# Patient Record
Sex: Female | Born: 1950
Health system: Southern US, Community
[De-identification: ages and names within clinical notes are randomized; demographics above are authoritative.]

## PROBLEM LIST (undated history)

## (undated) DIAGNOSIS — I10 Essential (primary) hypertension: Secondary | ICD-10-CM

## (undated) DIAGNOSIS — M1990 Unspecified osteoarthritis, unspecified site: Secondary | ICD-10-CM

## (undated) DIAGNOSIS — E785 Hyperlipidemia, unspecified: Secondary | ICD-10-CM

## (undated) DIAGNOSIS — R112 Nausea with vomiting, unspecified: Secondary | ICD-10-CM

## (undated) DIAGNOSIS — Z9889 Other specified postprocedural states: Secondary | ICD-10-CM

## (undated) HISTORY — PX: TUBAL LIGATION: SHX77

## (undated) HISTORY — DX: Essential (primary) hypertension: I10

## (undated) HISTORY — DX: Hyperlipidemia, unspecified: E78.5

## (undated) HISTORY — DX: Unspecified osteoarthritis, unspecified site: M19.90

---

## 1988-03-09 HISTORY — PX: WRIST SURGERY: SHX841

## 2006-10-08 ENCOUNTER — Ambulatory Visit: Payer: Self-pay | Admitting: Internal Medicine

## 2007-09-22 ENCOUNTER — Ambulatory Visit: Payer: Self-pay | Admitting: Internal Medicine

## 2008-09-24 ENCOUNTER — Ambulatory Visit: Payer: Self-pay | Admitting: Internal Medicine

## 2008-12-05 ENCOUNTER — Other Ambulatory Visit: Payer: Self-pay | Admitting: Internal Medicine

## 2008-12-13 ENCOUNTER — Ambulatory Visit: Payer: Self-pay | Admitting: Internal Medicine

## 2009-09-25 ENCOUNTER — Ambulatory Visit: Payer: Self-pay | Admitting: Internal Medicine

## 2010-12-01 ENCOUNTER — Ambulatory Visit: Payer: Self-pay | Admitting: Internal Medicine

## 2011-07-20 IMAGING — CR PELVIS - 1-2 VIEW
1 series · 1 of 1 positions shown · non-contrast
Comparison: none

REASON FOR EXAM: 2wks pain server lt ill[facility]  [facility]
COMMENTS:

PROCEDURE:     DXR - DXR PELVIS AP ONLY  - December 05, 2008 [DATE]
RESULT:     There is no evidence of fracture, dislocation or malalignment.
Subchondral sclerosis is appreciated involving the pubic symphysis.

[view not recorded]
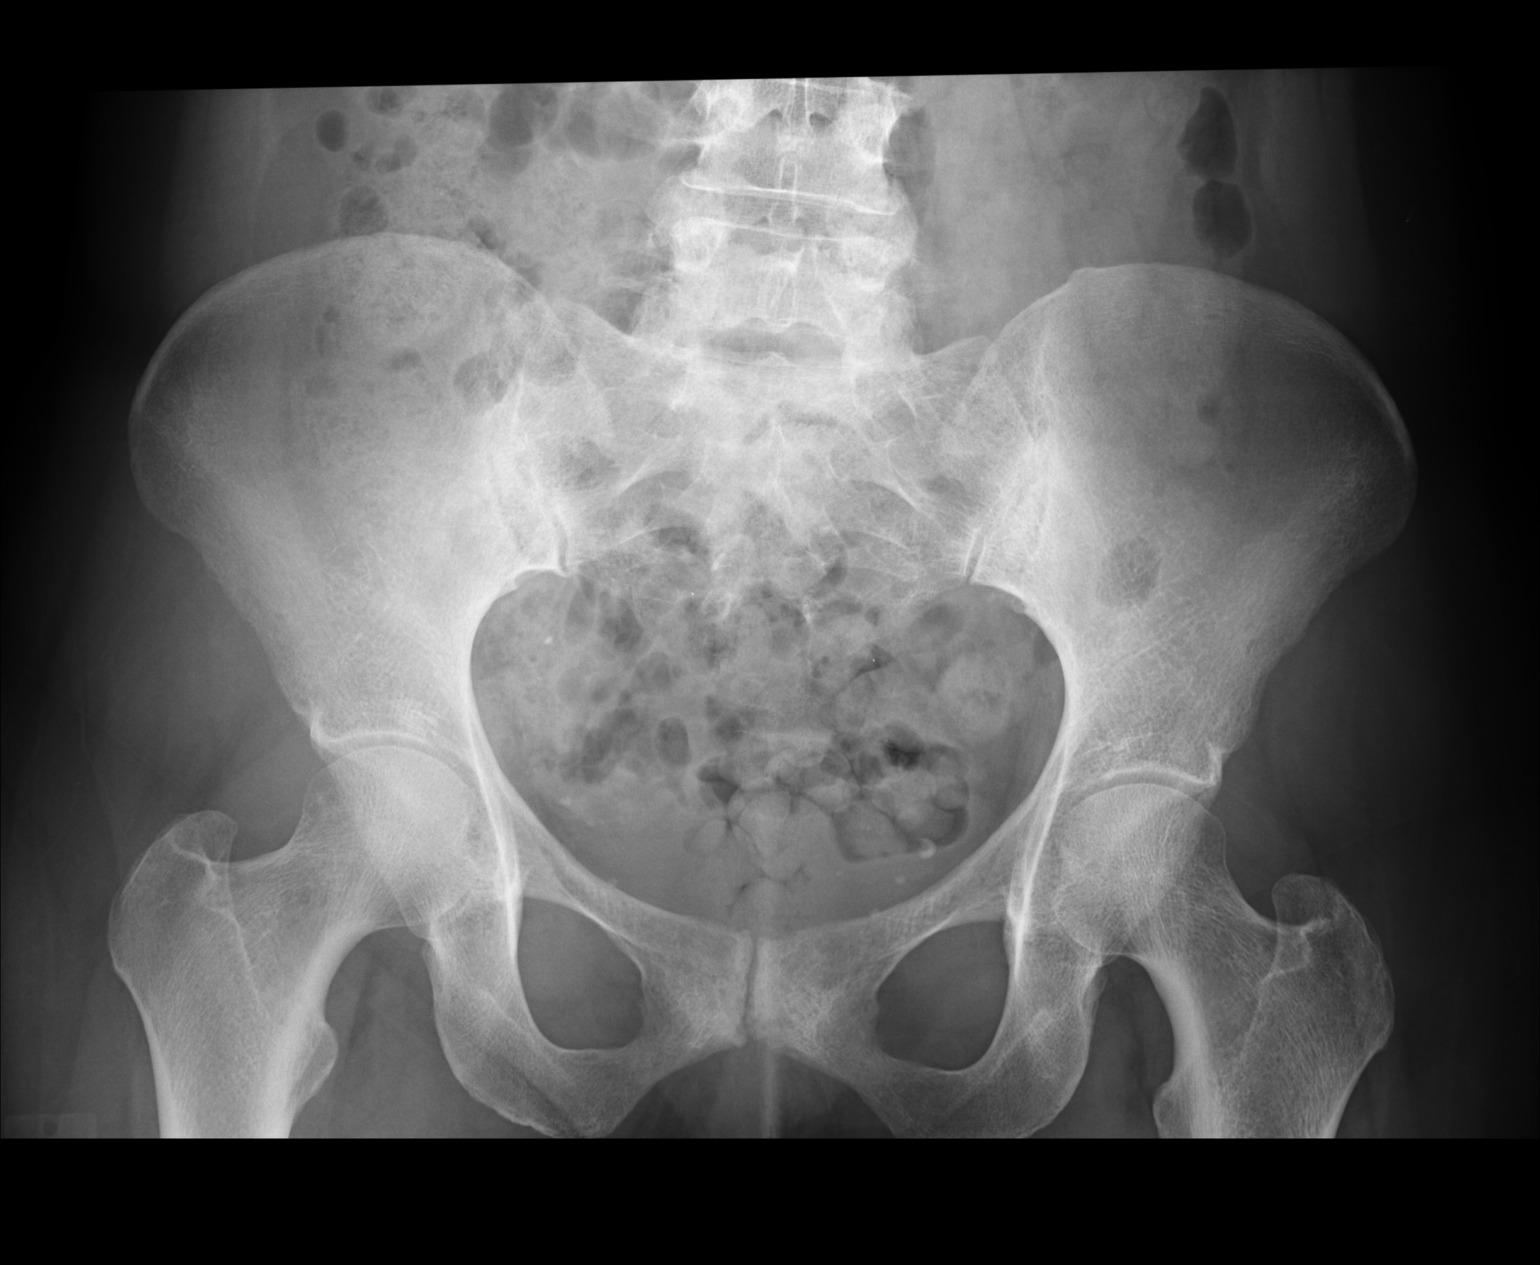

[1 of 1 positions shown; findings below may reference images not displayed]

IMPRESSION: 1. No evidence of acute osseous abnormalities.
2. Degenerative changes involve the pubic symphysis.
3. If there is persistent clinical concern, further evaluation with pelvic
fracture protocol MRI is recommended.

## 2011-12-03 ENCOUNTER — Ambulatory Visit: Payer: Self-pay | Admitting: Internal Medicine

## 2011-12-31 ENCOUNTER — Ambulatory Visit: Payer: Self-pay | Admitting: Internal Medicine

## 2018-10-07 ENCOUNTER — Other Ambulatory Visit: Payer: Self-pay | Admitting: Internal Medicine

## 2018-10-07 DIAGNOSIS — Z20822 Contact with and (suspected) exposure to covid-19: Secondary | ICD-10-CM

## 2018-10-09 LAB — NOVEL CORONAVIRUS, NAA: SARS-CoV-2, NAA: NOT DETECTED

## 2018-11-08 ENCOUNTER — Other Ambulatory Visit: Payer: Self-pay

## 2018-11-08 ENCOUNTER — Encounter: Payer: Self-pay | Admitting: Nurse Practitioner

## 2018-11-08 ENCOUNTER — Ambulatory Visit (INDEPENDENT_AMBULATORY_CARE_PROVIDER_SITE_OTHER): Payer: Medicare Other | Admitting: Nurse Practitioner

## 2018-11-08 VITALS — BP 114/57 | HR 73 | Ht 65.5 in | Wt 138.6 lb

## 2018-11-08 DIAGNOSIS — I10 Essential (primary) hypertension: Secondary | ICD-10-CM

## 2018-11-08 DIAGNOSIS — Z7689 Persons encountering health services in other specified circumstances: Secondary | ICD-10-CM

## 2018-11-08 DIAGNOSIS — E785 Hyperlipidemia, unspecified: Secondary | ICD-10-CM

## 2018-11-08 DIAGNOSIS — M545 Low back pain, unspecified: Secondary | ICD-10-CM

## 2018-11-08 DIAGNOSIS — G8929 Other chronic pain: Secondary | ICD-10-CM

## 2018-11-08 MED ORDER — ROSUVASTATIN CALCIUM 5 MG PO TABS: ORAL_TABLET | ORAL | 1 refills | Status: DC

## 2018-11-08 MED ORDER — LISINOPRIL 2.5 MG PO TABS: 5.0000 mg | ORAL_TABLET | Freq: Every day | ORAL | 1 refills | Status: DC

## 2018-11-08 MED ORDER — LIDOCAINE 4 % EX PTCH: 1.0000 | MEDICATED_PATCH | Freq: Every day | CUTANEOUS | 5 refills | Status: DC | PRN

## 2018-11-08 NOTE — Progress Notes (Signed)
Subjective:    Patient ID: Cassandra Cooley, female    DOB: 1950-12-25, 68 y.o.   MRN: [id number]  Cassandra Cooley is a 68 y.o. female presenting on 11/08/2018 for Establish Care (pt recently relocated to the area. )  HPI [place] Provider Pt last seen by PCP about 6 months ago from [place], [place] area.  Obtain records - [employer].    Hypertension - She is not checking BP at home or outside of clinic.    - Current medications: lisinopril 2.5 mg once daily, tolerating well without side effects - She is not currently symptomatic. - Pt denies headache, lightheadedness, dizziness, changes in vision, chest tightness/pressure, palpitations, leg swelling, sudden loss of speech or loss of consciousness. - Her diet is moderate in salt, moderate in fat, and moderate in carbohydrates.   Hyperlipidemia Lost weight, improved diet.  Needs recheck. - Patient denies muscle cramps.  Occasionally cannot sleep in shoulders/  Taking daily.  Decreased from 10 mg.   Lidocaine patch for back.   Widow x 99 years - husband died cancer  Past Medical History:  Diagnosis Date  . Arthritis    hands  . Hyperlipidemia   . Hypertension    Past Surgical History:  Procedure Laterality Date  . WRIST SURGERY Right 1990   Social History   Socioeconomic History  . Marital status: Widowed    Spouse name: Not on file  . Number of children: Not on file  . Years of education: Not on file  . Highest education level: Not on file  Occupational History  . Not on file  Social Needs  . Financial resource strain: Not on file  . Food insecurity    Worry: Not on file    Inability: Not on file  . Transportation needs    Medical: Not on file    Non-medical: Not on file  Tobacco Use  . Smoking status: Never Smoker  . Smokeless tobacco: Never Used  Substance and Sexual Activity  . Alcohol use: Yes    Alcohol/week: 7.0 standard drinks    Types: 7 Glasses of wine per week    Comment: glass red  wine  every evening   . Drug use: Never  . Sexual activity: Not on file  Lifestyle  . Physical activity    Days per week: Not on file    Minutes per session: Not on file  . Stress: Not on file  Relationships  . Social Herbalist on phone: Not on file    Gets together: Not on file    Attends religious service: Not on file    Active member of club or organization: Not on file    Attends meetings of clubs or organizations: Not on file    Relationship status: Not on file  . Intimate partner violence    Fear of current or ex partner: Not on file    Emotionally abused: Not on file    Physically abused: Not on file    Forced sexual activity: Not on file  Other Topics Concern  . Not on file  Social History Narrative  . Not on file   Family History  Problem Relation Age of Onset  . Breast cancer Mother 23  . Hyperlipidemia Father   . Heart disease Father 58  . Kidney failure Sister   . Hypertension Sister   . Diabetes Mellitus II Maternal Grandfather   . Heart disease Paternal Grandmother   .  Heart disease Paternal Grandfather    Current Outpatient Medications on File Prior to Visit  Medication Sig  . aspirin 81 MG EC tablet Take 81 mg by mouth daily. Swallow whole.  . Cholecalciferol (VITAMIN D3 PO) Take 2,000 Units by mouth 2 (two) times a week.  Marland Kitchen lisinopril (ZESTRIL) 2.5 MG tablet Take 2 tablets by mouth daily.  . rosuvastatin (CRESTOR) 5 MG tablet TK 1 T PO QD FOR HIGH CHOLESTEROL   No current facility-administered medications on file prior to visit.     Review of Systems  Constitutional: Negative for activity change, appetite change and fatigue.  Respiratory: Negative for cough and shortness of breath.   Cardiovascular: Negative for chest pain, palpitations and leg swelling.  Gastrointestinal: Negative for constipation, diarrhea, nausea and vomiting.  Genitourinary: Negative for dysuria, frequency and urgency.  Musculoskeletal: Positive for arthralgias and back  pain (low back). Negative for myalgias.  Skin: Negative for rash.  Neurological: Negative for dizziness and headaches.  Psychiatric/Behavioral: Negative for dysphoric mood. The patient is not nervous/anxious.    Per HPI unless specifically indicated above     Objective:    BP (!) 114/57 (BP Location: Left Arm, Patient Position: Sitting, Cuff Size: Normal)   Pulse 73   Ht 5' 5.5" (1.664 m)   Wt 138 lb 9.6 oz (62.9 kg)   BMI 22.71 kg/m   Wt Readings from Last 3 Encounters:  11/08/18 138 lb 9.6 oz (62.9 kg)    Physical Exam Vitals signs reviewed.  Constitutional:      General: She is not in acute distress.    Appearance: She is well-developed.  HENT:     Head: Normocephalic and atraumatic.  Neck:     Vascular: No carotid bruit.  Cardiovascular:     Rate and Rhythm: Normal rate and regular rhythm.     Pulses:          Radial pulses are 2+ on the right side and 2+ on the left side.       Posterior tibial pulses are 1+ on the right side and 1+ on the left side.     Heart sounds: Normal heart sounds, S1 normal and S2 normal.  Pulmonary:     Effort: Pulmonary effort is normal. No respiratory distress.     Breath sounds: Normal breath sounds and air entry.  Musculoskeletal:     Right lower leg: No edema.     Left lower leg: No edema.  Skin:    General: Skin is warm and dry.     Capillary Refill: Capillary refill takes less than 2 seconds.  Neurological:     General: No focal deficit present.     Mental Status: She is alert and oriented to person, place, and time.  Psychiatric:        Attention and Perception: Attention normal.        Mood and Affect: Mood and affect normal.        Behavior: Behavior normal. Behavior is cooperative.    Results for orders placed or performed in visit on 10/07/18  Novel Coronavirus, NAA ([facility])   Specimen: Nasopharyngeal(NP) swabs in vial transport medium   NASOPHARYNGE  KNOWN EX  Result Value Ref Range   SARS-CoV-2, NAA Not Detected  Not Detected      Assessment & Plan:   Problem List Items Addressed This Visit    None    Visit Diagnoses    Chronic midline low back pain without sciatica    -  Primary Patient with stable, chronic low back pain currently managed with lidocaine patches.  Patient requests refill. States insurance has been covering the patches.   Follow-up prn.   Relevant Medications   aspirin 81 MG EC tablet   Lidocaine (HM LIDOCAINE PATCH) 4 % PTCH   Essential hypertension     Controlled hypertension.  BP goal < 130/80.  Pt is working on lifestyle modifications.  Taking medications tolerating well without side effects. No current complications.  Plan: 1. Continue taking lisinopril 2.5 mg once daily - consider stopping if hypotensive in future 2. Obtain labs with physical  3. Encouraged heart healthy diet and increasing exercise to 30 minutes most days of the week. 4. Check BP 1-2 x per week at home, keep log, and bring to clinic at next appointment. 5. Follow up 6 months.     Relevant Medications   aspirin 81 MG EC tablet   rosuvastatin (CRESTOR) 5 MG tablet   lisinopril (ZESTRIL) 2.5 MG tablet   Hyperlipidemia, unspecified hyperlipidemia type     Status unknown. Recent lifestyle changes and weight loss.  Possible improvement. Recheck labs.  Continue meds without changes today.  Refills provided. Followup 6 months and prn after labs.    Relevant Medications   aspirin 81 MG EC tablet   rosuvastatin (CRESTOR) 5 MG tablet   lisinopril (ZESTRIL) 2.5 MG tablet   Encounter to establish care     Previous PCP was at [facility].  Records will be requested.  Past medical, family, and surgical history reviewed w/ pt.        Meds ordered this encounter  Medications  . Lidocaine (HM LIDOCAINE PATCH) 4 % PTCH    Sig: Apply 1 patch topically daily as needed.    Dispense:  30 patch    Refill:  5    Place on file for future fill    Order Specific Question:   Supervising Provider    Answer:    Olin Hauser [[id number]]  . rosuvastatin (CRESTOR) 5 MG tablet    Sig: TK 1 T PO QD FOR HIGH CHOLESTEROL    Dispense:  90 tablet    Refill:  1    Order Specific Question:   Supervising Provider    Answer:   Olin Hauser [[id number]]  . lisinopril (ZESTRIL) 2.5 MG tablet    Sig: Take 2 tablets (5 mg total) by mouth daily.    Dispense:  90 tablet    Refill:  1    Order Specific Question:   Supervising Provider    Answer:   Olin Hauser [[id number]]   Follow up plan: Return in about 6 months (around 05/08/2019) for hypertension, cholesterol AND physical in next 2-4 weeks.  Cassell Smiles, DNP, AGPCNP-BC Adult Gerontology Primary Care Nurse Practitioner [facility] 11/08/2018, 10:29 AM

## 2018-11-08 NOTE — Patient Instructions (Addendum)
Cassandra Cooley,   Thank you for coming in to clinic today.  1. Continue living a healthy lifestyle.  Stay active and eat well  2. Continue medications without changes today.  We will get labs with your physical.  If you have no physical coverage,  Call back and I will order labs today.  Please schedule a follow-up appointment with Cassell Smiles, AGNP. Return in about 6 months (around 05/08/2019) for hypertension, cholesterol AND physical in next 2-4 weeks.  If you have any other questions or concerns, please feel free to call the clinic or send a message through [place]. You may also schedule an earlier appointment if necessary.  You will receive a survey after today's visit either digitally by e-mail or paper by [employer]. Your experiences and feedback matter to [place].  Please respond so we know how we are doing as we provide care for you.   Cassell Smiles, DNP, AGNP-BC Adult Gerontology Nurse Practitioner [place]

## 2018-11-14 ENCOUNTER — Encounter: Payer: Self-pay | Admitting: Nurse Practitioner

## 2018-11-23 ENCOUNTER — Encounter: Payer: Self-pay | Admitting: Nurse Practitioner

## 2018-11-23 ENCOUNTER — Other Ambulatory Visit: Payer: Self-pay

## 2018-11-23 ENCOUNTER — Ambulatory Visit (INDEPENDENT_AMBULATORY_CARE_PROVIDER_SITE_OTHER): Payer: Medicare Other | Admitting: Nurse Practitioner

## 2018-11-23 VITALS — BP 110/57 | HR 71 | Ht 65.5 in | Wt 136.4 lb

## 2018-11-23 DIAGNOSIS — M545 Low back pain, unspecified: Secondary | ICD-10-CM

## 2018-11-23 DIAGNOSIS — Z130 Encounter for screening for diseases of the blood and blood-forming organs and certain disorders involving the immune mechanism: Secondary | ICD-10-CM | POA: Diagnosis not present

## 2018-11-23 DIAGNOSIS — E785 Hyperlipidemia, unspecified: Secondary | ICD-10-CM

## 2018-11-23 DIAGNOSIS — I10 Essential (primary) hypertension: Secondary | ICD-10-CM

## 2018-11-23 DIAGNOSIS — Z1231 Encounter for screening mammogram for malignant neoplasm of breast: Secondary | ICD-10-CM

## 2018-11-23 DIAGNOSIS — G8929 Other chronic pain: Secondary | ICD-10-CM

## 2018-11-23 MED ORDER — LIDOCAINE 5 % EX PTCH: 1.0000 | MEDICATED_PATCH | CUTANEOUS | 5 refills | Status: DC

## 2018-11-23 NOTE — Progress Notes (Signed)
Subjective:    Patient ID: Cassandra Cooley, female    DOB: 1951-02-12, 68 y.o.   MRN: [id number]  Cassandra Cooley is a 68 y.o. female presenting on 11/23/2018 for Medicare Wellness  HPI Hypertension Occasionally low BP.  Patient has had lower BP readings, lower weight, improved lifestyle.  Reducing bread. - Current medications: lisinopril 5 mg daily, tolerating well without side effects - She is not currently symptomatic, but endorses occasional dizziness. - Pt denies headache, changes in vision, chest tightness/pressure, palpitations, leg swelling, sudden loss of speech or loss of consciousness. - She  reports an exercise routine that includes walking, climbing stairs in her home, 5 days per week. - Her diet is moderate in salt, moderate in fat, and moderate in carbohydrates.   Chronic low back pain Pain is generally aching and sore.  Relieved partially with lidocaine patches.  Insurance will cover 5% patches.  Requests refill.  Dyslipidemia Patient taking rosuvastatin 5 mg daily and tolerates well except for general hand aching occasionally.   - Pt denies changes in vision, chest tightness/pressure, palpitations, shortness of breath, leg pain while walking, leg or arm weakness, and sudden loss of speech or loss of consciousness.   HEALTH MAINTENANCE: Weight/BMI: decreasing, healthy BMI Physical activity: regular currently with moving Diet: generally healthy now.  Recently improved for weight loss and has seen success with reaching goals Seatbelt: always Sunscreen: not usually PAP: n/a Mammogram: due DEXA: declines Colon Cancer Screen: declines HIV/HEP C: declines Optometry: regular Dentistry: regular  VACCINES: Tetanus: 7 years ago Influenza: Will get at [facility] Pneumonia: declines Shingles: Recommmended  Social History   Tobacco Use  . Smoking status: Never Smoker  . Smokeless tobacco: Never Used  Substance Use Topics  . Alcohol use: Yes    Alcohol/week: 7.0  standard drinks    Types: 7 Glasses of wine per week    Comment: glass red  wine every evening   . Drug use: Never   Review of Systems Per HPI unless specifically indicated above     Objective:    BP (!) 110/57 (BP Location: Left Arm, Patient Position: Sitting, Cuff Size: Normal)   Pulse 71   Ht 5' 5.5" (1.664 m)   Wt 136 lb 6.4 oz (61.9 kg)   BMI 22.35 kg/m   Wt Readings from Last 3 Encounters:  11/23/18 136 lb 6.4 oz (61.9 kg)  11/08/18 138 lb 9.6 oz (62.9 kg)    Physical Exam Vitals signs and nursing note reviewed.  Constitutional:      General: She is not in acute distress.    Appearance: She is well-developed.  HENT:     Head: Normocephalic and atraumatic.     Right Ear: External ear normal.     Left Ear: External ear normal.     Nose: Nose normal.  Eyes:     Conjunctiva/sclera: Conjunctivae normal.     Pupils: Pupils are equal, round, and reactive to light.  Neck:     Musculoskeletal: Normal range of motion and neck supple.     Thyroid: No thyromegaly.     Vascular: No JVD.     Trachea: No tracheal deviation.  Cardiovascular:     Rate and Rhythm: Normal rate and regular rhythm.     Heart sounds: Normal heart sounds. No murmur. No friction rub. No gallop.   Pulmonary:     Effort: Pulmonary effort is normal. No respiratory distress.     Breath sounds: Normal breath sounds.  Chest:  Comments: Breast - Normal exam w/ symmetric breasts, no mass, no nipple discharge, no skin changes or tenderness.   Abdominal:     General: Bowel sounds are normal. There is no distension.     Palpations: Abdomen is soft.     Tenderness: There is no abdominal tenderness.  Musculoskeletal: Normal range of motion.  Lymphadenopathy:     Cervical: No cervical adenopathy.  Skin:    General: Skin is warm and dry.  Neurological:     Mental Status: She is alert and oriented to person, place, and time.     Cranial Nerves: No cranial nerve deficit.  Psychiatric:        Behavior:  Behavior normal.        Thought Content: Thought content normal.        Judgment: Judgment normal.    Results for orders placed or performed in visit on 10/07/18  Novel Coronavirus, NAA ([facility])   Specimen: Nasopharyngeal(NP) swabs in vial transport medium   NASOPHARYNGE  KNOWN EX  Result Value Ref Range   SARS-CoV-2, NAA Not Detected Not Detected      Assessment & Plan:   Problem List Items Addressed This Visit    None    Visit Diagnoses    Chronic midline low back pain without sciatica   Stable.  Needs refill lidoderm patches.  Refill provided. Follow-up 1 year and prn.   Relevant Medications   lidocaine (LIDODERM) 5 %   Essential hypertension      -  Primary Improved and well-controlled hypertension.  BP goal < 130/80.  Pt is working on lifestyle modifications.  Taking medications tolerating well without side effects. No current complications other than mild hypotension occasionally  Plan: 1. STOP lisinopril - recheck BP regularly at home.  Reviewed BP goal < 130/80. 2. Obtain labs   3. Encouraged heart healthy diet and increasing exercise to 30 minutes most days of the week. 4. Check BP 1-2 x per week at home, keep log, and bring to clinic at next appointment. 5. Follow up 6 months.     Relevant Orders   Comprehensive metabolic panel (Completed)   Hyperlipidemia, unspecified hyperlipidemia type     Unknown status of hyperlipidemia.  Currently taking rosuvastatin daily. No new ASCVD events since last visit.   - No personal history of prior ASCVD events - Family history of ASCVD events/atherosclerosis.  Plan: 1. Continue taking rosuvastatin 5 mg once daily. - Reviewed common side effects of myalgia (reversible off med), less common side effects of cognitive impairment (reversible off med), increased glucose, rhabdomyolysis.  Discussed may consider lowering dose if arthralgias worsen. 2. Discussed low glycemic diet.  3. Discussed increasing exercise to 30 minutes most  days of the week. 4. Follow-up with labs in about 3 months.    Relevant Orders   Lipid panel (Completed)   TSH (Completed)   Screening for deficiency anemia     Patient needing anemia screening.  Labs today.    Screening mammogram, encounter for     Patient due for screening mammo.  Order placed.  Follow-up 1 year.   Relevant Orders   MM 3D SCREEN BREAST BILATERAL      Meds ordered this encounter  Medications  . lidocaine (LIDODERM) 5 %    Sig: Place 1 patch onto the skin daily. Remove & Discard patch within 12 hours or as directed by MD    Dispense:  30 patch    Refill:  5    Order Specific  Question:   Supervising Provider    Answer:   Olin Hauser [[id number]]    Follow up plan: Return in about 6 months (around 05/23/2019) for hypertension, cholesterol.  Cassell Smiles, DNP, AGPCNP-BC Adult Gerontology Primary Care Nurse Practitioner [facility] 11/23/2018, 11:16 AM

## 2018-11-23 NOTE — Patient Instructions (Addendum)
Cassandra Cooley,   Thank you for coming in to clinic today.  1. Monitor BP once or twice per week.  - GOAL is less than 130/90 - STOP lisinopril - Continue healthy lifestyle.  2. Labs today. Results will be called to you in 1-2 days.   Please schedule a follow-up appointment with Cassell Smiles, AGNP. Return in about 6 months (around 05/23/2019) for hypertension, cholesterol.  If you have any other questions or concerns, please feel free to call the clinic or send a message through [place]. You may also schedule an earlier appointment if necessary.  You will receive a survey after today's visit either digitally by e-mail or paper by [employer]. Your experiences and feedback matter to [place].  Please respond so we know how we are doing as we provide care for you.   Cassell Smiles, DNP, AGNP-BC Adult Gerontology Nurse Practitioner [place]

## 2018-11-24 LAB — COMPREHENSIVE METABOLIC PANEL
AG Ratio: 1.6 (calc) (ref 1.0–2.5)
ALT: 13 U/L (ref 6–29)
AST: 21 U/L (ref 10–35)
Albumin: 4.7 g/dL (ref 3.6–5.1)
Alkaline phosphatase (APISO): 71 U/L (ref 37–153)
BUN/Creatinine Ratio: 19 (calc) (ref 6–22)
BUN: 24 mg/dL (ref 7–25)
CO2: 24 mmol/L (ref 20–32)
Calcium: 10.1 mg/dL (ref 8.6–10.4)
Chloride: 101 mmol/L (ref 98–110)
Creat: 1.28 mg/dL — ABNORMAL HIGH (ref 0.60–0.93)
Globulin: 2.9 g/dL (calc) (ref 1.9–3.7)
Glucose, Bld: 89 mg/dL (ref 65–99)
Potassium: 4.9 mmol/L (ref 3.5–5.3)
Sodium: 135 mmol/L (ref 135–146)
Total Bilirubin: 0.6 mg/dL (ref 0.2–1.2)
Total Protein: 7.6 g/dL (ref 6.1–8.1)

## 2018-11-24 LAB — LIPID PANEL
Cholesterol: 234 mg/dL — ABNORMAL HIGH (ref <200)
HDL: 119 mg/dL (ref >=50)
LDL Cholesterol (Calc): 102 mg/dL (calc) — ABNORMAL HIGH
Non-HDL Cholesterol (Calc): 115 mg/dL (calc) (ref <130)
Total CHOL/HDL Ratio: 2 (calc) (ref <5.0)
Triglycerides: 45 mg/dL (ref <150)

## 2018-11-24 LAB — TSH: TSH: 0.71 mIU/L (ref 0.40–4.50)

## 2018-11-25 ENCOUNTER — Encounter: Payer: Self-pay | Admitting: Nurse Practitioner

## 2018-12-28 ENCOUNTER — Other Ambulatory Visit: Payer: Self-pay | Admitting: Nurse Practitioner

## 2018-12-28 ENCOUNTER — Ambulatory Visit
Admission: RE | Admit: 2018-12-28 | Discharge: 2018-12-28 | Disposition: A | Discharge status: Home / Self Care | Payer: Medicare Other | Source: Ambulatory Visit | Attending: Nurse Practitioner | Admitting: Nurse Practitioner

## 2018-12-28 DIAGNOSIS — Z1231 Encounter for screening mammogram for malignant neoplasm of breast: Secondary | ICD-10-CM | POA: Diagnosis present

## 2018-12-28 DIAGNOSIS — R928 Other abnormal and inconclusive findings on diagnostic imaging of breast: Secondary | ICD-10-CM

## 2018-12-28 DIAGNOSIS — N6489 Other specified disorders of breast: Secondary | ICD-10-CM

## 2019-01-12 ENCOUNTER — Ambulatory Visit
Admission: RE | Admit: 2019-01-12 | Discharge: 2019-01-12 | Disposition: A | Discharge status: Home / Self Care | Payer: Medicare Other | Source: Ambulatory Visit | Attending: Nurse Practitioner | Admitting: Nurse Practitioner

## 2019-01-12 DIAGNOSIS — N6489 Other specified disorders of breast: Secondary | ICD-10-CM

## 2019-01-12 DIAGNOSIS — R928 Other abnormal and inconclusive findings on diagnostic imaging of breast: Secondary | ICD-10-CM | POA: Diagnosis not present

## 2019-04-06 ENCOUNTER — Other Ambulatory Visit: Payer: Self-pay | Admitting: Nurse Practitioner

## 2019-04-06 DIAGNOSIS — I10 Essential (primary) hypertension: Secondary | ICD-10-CM

## 2019-05-09 ENCOUNTER — Ambulatory Visit: Payer: Self-pay | Admitting: Nurse Practitioner

## 2019-05-16 ENCOUNTER — Ambulatory Visit (INDEPENDENT_AMBULATORY_CARE_PROVIDER_SITE_OTHER): Payer: Medicare Other

## 2019-05-16 VITALS — BP 141/73 | Wt 135.0 lb

## 2019-05-16 DIAGNOSIS — Z0000 Encounter for general adult medical examination without abnormal findings: Secondary | ICD-10-CM

## 2019-05-16 NOTE — Progress Notes (Signed)
Subjective:   Cassandra Cooley is a 69 y.o. female who presents for an Initial Medicare Annual Wellness Visit.  This visit is being conducted via phone call  - after an attmept to do on video chat - due to the COVID-19 pandemic. This patient has given me verbal consent via phone to conduct this visit, patient states they are participating from their home address. Some vital signs may be absent or patient reported.   Patient identification: identified by name, DOB, and current address.    Review of Systems      Cardiac Risk Factors include: advanced age (>24men, >29 women);dyslipidemia;hypertension     Objective:    Today's Vitals   05/16/19 1000  BP: (!) 141/73  Weight: 135 lb (61.2 kg)   Body mass index is 22.12 kg/m.  Advanced Directives 05/16/2019  Does Patient Have a Medical Advance Directive? Yes    Current Medications (verified) Outpatient Encounter Medications as of 05/16/2019  Medication Sig  . aspirin 81 MG EC tablet Take 81 mg by mouth daily. Swallow whole.  . Cholecalciferol (VITAMIN D3 PO) Take 2,000 Units by mouth 2 (two) times a week.  . lidocaine (LIDODERM) 5 % Place 1 patch onto the skin daily. Remove & Discard patch within 12 hours or as directed by MD  . rosuvastatin (CRESTOR) 5 MG tablet TK 1 T PO QD FOR HIGH CHOLESTEROL   No facility-administered encounter medications on file as of 05/16/2019.    Allergies (verified) Shellfish allergy   History: Past Medical History:  Diagnosis Date  . Arthritis    hands  . Hyperlipidemia   . Hypertension    Past Surgical History:  Procedure Laterality Date  . TUBAL LIGATION    . WRIST SURGERY Right 1990   Family History  Problem Relation Age of Onset  . Breast cancer Mother 18  . Hyperlipidemia Father   . Heart disease Father 14  . Kidney failure Sister   . Hypertension Sister   . Diabetes Mellitus II Maternal Grandfather   . Heart disease Paternal Grandmother   . Heart disease Paternal Grandfather   .  Healthy Brother   . Healthy Daughter    Social History   Socioeconomic History  . Marital status: Widowed    Spouse name: Not on file  . Number of children: Not on file  . Years of education: Not on file  . Highest education level: Not on file  Occupational History  . Occupation: retired   Tobacco Use  . Smoking status: Never Smoker  . Smokeless tobacco: Never Used  Substance and Sexual Activity  . Alcohol use: Yes    Alcohol/week: 7.0 standard drinks    Types: 7 Glasses of wine per week    Comment: glass white wine every evening   . Drug use: Never  . Sexual activity: Not Currently  Other Topics Concern  . Not on file  Social History Narrative  . Not on file   Social Determinants of Health   Financial Resource Strain:   . Difficulty of Paying Living Expenses: Not on file  Food Insecurity:   . Worried About Charity fundraiser in the Last Year: Not on file  . Ran Out of Food in the Last Year: Not on file  Transportation Needs:   . Lack of Transportation (Medical): Not on file  . Lack of Transportation (Non-Medical): Not on file  Physical Activity:   . Days of Exercise per Week: Not on file  . Minutes  of Exercise per Session: Not on file  Stress:   . Feeling of Stress : Not on file  Social Connections:   . Frequency of Communication with Friends and Family: Not on file  . Frequency of Social Gatherings with Friends and Family: Not on file  . Attends Religious Services: Not on file  . Active Member of Clubs or Organizations: Not on file  . Attends Archivist Meetings: Not on file  . Marital Status: Not on file    Tobacco Counseling Counseling given: Not Answered   Clinical Intake:                        Activities of Daily Living In your present state of health, do you have any difficulty performing the following activities: 05/16/2019 11/23/2018  Hearing? N N  Comment no hearing aids -  Vision? Y Y  Comment eye dr at [place]  [facility] -  Difficulty concentrating or making decisions? N N  Walking or climbing stairs? N N  Dressing or bathing? N N  Doing errands, shopping? N N  Preparing Food and eating ? N N  Using the Toilet? N N  In the past six months, have you accidently leaked urine? N N  Do you have problems with loss of bowel control? N N  Managing your Medications? N N  Managing your Finances? N N  Housekeeping or managing your Housekeeping? N N  Some recent data might be hidden     Immunizations and Health Maintenance Immunization History  Administered Date(s) Administered  . Influenza-Unspecified 12/20/2018   Health Maintenance Due  Topic Date Due  . Hepatitis C Screening  [birth year]  . COLONOSCOPY  08/28/1998  . DEXA SCAN  08/27/2013    Patient Care Team: Mikey College, NP (Inactive) as PCP - General (Nurse Practitioner)  Indicate any recent Medical Services you may have received from other than [facility] providers in the past year (date may be approximate).     Assessment:   This is a routine wellness examination for Cassandra Cooley.  Hearing/Vision screen No exam data present  Dietary issues and exercise activities discussed: Current Exercise Habits: Structured exercise class, Type of exercise: strength training/weights;walking;treadmill, Time (Minutes): 30, Frequency (Times/Week): 3, Weekly Exercise (Minutes/Week): 90, Intensity: Mild, Exercise limited by: None identified  Goals Addressed   None    Depression Screen PHQ 2/9 Scores 05/16/2019 11/23/2018 11/08/2018  PHQ - 2 Score 0 0 0    Fall Risk Fall Risk  05/16/2019 11/23/2018 11/08/2018  Falls in the past year? 1 0 0  Number falls in past yr: 0 0 0  Injury with Fall? 0 - -   FALL RISK PREVENTION PERTAINING TO THE HOME:  Any stairs in or around the home? Yes  If so, are there any without handrails? No   Home free of loose throw rugs in walkways, pet beds, electrical cords, etc? Yes  Adequate lighting in your home to reduce risk of  falls? Yes   ASSISTIVE DEVICES UTILIZED TO PREVENT FALLS:  Life alert? No  Use of a cane, walker or w/c? No  Grab bars in the bathroom? No  Shower chair or bench in shower? No  Elevated toilet seat or a handicapped toilet? No    DME ORDERS:  DME order needed?  No   TIMED UP AND GO:  Unable to perform    Cognitive Function:        Screening Tests Health Maintenance  Topic Date Due  . Hepatitis C Screening  12/17/1950  . COLONOSCOPY  08/28/1998  . DEXA SCAN  08/27/2013  . TETANUS/TDAP  05/15/2020 (Originally 08/28/1967)  . PNA vac Low Risk Adult (1 of 2 - PCV13) 05/15/2020 (Originally 08/27/2013)  . MAMMOGRAM  12/27/2020  . INFLUENZA VACCINE  Completed    Qualifies for Shingles Vaccine? Declined   Tdap: Discussed need for TD/TDAP vaccine, patient verbalized understanding that this is not covered as a preventative with there insurance and to call the office if he develops any new skin injuries, ie: cuts, scrapes, bug bites, or open wounds.  Flu Vaccine: up to date   Pneumococcal Vaccine: declined    Covid-19 Vaccine: declined   Cancer Screenings:  Colorectal Screening: completed cologuard 2 years ago.   Mammogram: Completed 12/28/2018. Repeat every year  Bone Density: ordered Please call [phone] to schedule your mammogram.   Lung Cancer Screening: (Low Dose CT Chest recommended if Age 52-80 years, 30 pack-year currently smoking OR have quit w/in 15years.) does not qualify.    Additional Screening:  Hepatitis C Screening: doesn't qualify   Vision Screening: Recommended annual ophthalmology exams for early detection of glaucoma and other disorders of the eye. Is the patient up to date with their annual eye exam?  Yes  Who is the provider or what is the name of the office in which the pt attends annual eye exams[facility]   Dental Screening: Recommended annual dental exams for proper oral hygiene  Community Resource Referral:  CRR  required this visit?  No       Plan:  I have personally reviewed and addressed the Medicare Annual Wellness questionnaire and have noted the following in the patient's chart:  A. Medical and social history B. Use of alcohol, tobacco or illicit drugs  C. Current medications and supplements D. Functional ability and status E.  Nutritional status F.  Physical activity G. Advance directives H. List of other physicians I.  Hospitalizations, surgeries, and ER visits in previous 12 months J.  [place] such as hearing and vision if needed, cognitive and depression L. Referrals and appointments   In addition, I have reviewed and discussed with patient certain preventive protocols, quality metrics, and best practice recommendations. A written personalized care plan for preventive services as well as general preventive health recommendations were provided to patient.   Signed,    Bevelyn Ngo, LPN   [id number]  Nurse Health Advisor   Nurse Notes: rosuvastatin is still causing muscle aching.

## 2019-05-16 NOTE — Patient Instructions (Signed)
Ms. Strittmatter , Thank you for taking time to come for your Medicare Wellness Visit. I appreciate your ongoing commitment to your health goals. Please review the following plan we discussed and let me know if I can assist you in the future.   Screening recommendations/referrals: Colonoscopy: up to date  Mammogram: up to date  Bone Density: Please call [phone] to schedule your bone density.  Recommended yearly ophthalmology/optometry visit for glaucoma screening and checkup Recommended yearly dental visit for hygiene and checkup  Vaccinations: Influenza vaccine: up to date  Pneumococcal vaccine: declined  Tdap vaccine: up to date Shingles vaccine: shingrix eligible, declined  Covid-19: declined   Advanced directives: Advance directive discussed with you today. Once this is complete please bring a copy in to our office so we can scan it into your chart.  Conditions/risks identified: none   Next appointment: Follow up in one year for your annual wellness visit    Preventive Care 65 Years and Older, Female Preventive care refers to lifestyle choices and visits with your health care provider that can promote health and wellness. What does preventive care include?  A yearly physical exam. This is also called an annual well check.  Dental exams once or twice a year.  Routine eye exams. Ask your health care provider how often you should have your eyes checked.  Personal lifestyle choices, including:  Daily care of your teeth and gums.  Regular physical activity.  Eating a healthy diet.  Avoiding tobacco and drug use.  Limiting alcohol use.  Practicing safe sex.  Taking low-dose aspirin every day.  Taking vitamin and mineral supplements as recommended by your health care provider. What happens during an annual well check? The services and screenings done by your health care provider during your annual well check will depend on your age, overall health, lifestyle risk  factors, and family history of disease. Counseling  Your health care provider may ask you questions about your:  Alcohol use.  Tobacco use.  Drug use.  Emotional well-being.  Home and relationship well-being.  Sexual activity.  Eating habits.  History of falls.  Memory and ability to understand (cognition).  Work and work Statistician.  Reproductive health. Screening  You may have the following tests or measurements:  Height, weight, and BMI.  Blood pressure.  Lipid and cholesterol levels. These may be checked every 5 years, or more frequently if you are over 62 years old.  Skin check.  Lung cancer screening. You may have this screening every year starting at age 83 if you have a 30-pack-year history of smoking and currently smoke or have quit within the past 15 years.  Fecal occult blood test (FOBT) of the stool. You may have this test every year starting at age 71.  Flexible sigmoidoscopy or colonoscopy. You may have a sigmoidoscopy every 5 years or a colonoscopy every 10 years starting at age 41.  Hepatitis C blood test.  Hepatitis B blood test.  Sexually transmitted disease (STD) testing.  Diabetes screening. This is done by checking your blood sugar (glucose) after you have not eaten for a while (fasting). You may have this done every 1-3 years.  Bone density scan. This is done to screen for osteoporosis. You may have this done starting at age 62.  Mammogram. This may be done every 1-2 years. Talk to your health care provider about how often you should have regular mammograms. Talk with your health care provider about your test results, treatment options, and if necessary,  the need for more tests. Vaccines  Your health care provider may recommend certain vaccines, such as:  Influenza vaccine. This is recommended every year.  Tetanus, diphtheria, and acellular pertussis (Tdap, Td) vaccine. You may need a Td booster every 10 years.  Zoster vaccine. You  may need this after age 94.  Pneumococcal 13-valent conjugate (PCV13) vaccine. One dose is recommended after age 102.  Pneumococcal polysaccharide (PPSV23) vaccine. One dose is recommended after age 4. Talk to your health care provider about which screenings and vaccines you need and how often you need them. This information is not intended to replace advice given to you by your health care provider. Make sure you discuss any questions you have with your health care provider. Document Released: 03/22/2015 Document Revised: 11/13/2015 Document Reviewed: 12/25/2014 Elsevier Interactive Patient Education  2017 [place] Prevention in the Home Falls can cause injuries. They can happen to people of all ages. There are many things you can do to make your home safe and to help prevent falls. What can I do on the outside of my home?  Regularly fix the edges of walkways and driveways and fix any cracks.  Remove anything that might make you trip as you walk through a door, such as a raised step or threshold.  Trim any bushes or trees on the path to your home.  Use bright outdoor lighting.  Clear any walking paths of anything that might make someone trip, such as rocks or tools.  Regularly check to see if handrails are loose or broken. Make sure that both sides of any steps have handrails.  Any raised decks and porches should have guardrails on the edges.  Have any leaves, snow, or ice cleared regularly.  Use sand or salt on walking paths during winter.  Clean up any spills in your garage right away. This includes oil or grease spills. What can I do in the bathroom?  Use night lights.  Install grab bars by the toilet and in the tub and shower. Do not use towel bars as grab bars.  Use non-skid mats or decals in the tub or shower.  If you need to sit down in the shower, use a plastic, non-slip stool.  Keep the floor dry. Clean up any water that spills on the floor as soon as  it happens.  Remove soap buildup in the tub or shower regularly.  Attach bath mats securely with double-sided non-slip rug tape.  Do not have throw rugs and other things on the floor that can make you trip. What can I do in the bedroom?  Use night lights.  Make sure that you have a light by your bed that is easy to reach.  Do not use any sheets or blankets that are too big for your bed. They should not hang down onto the floor.  Have a firm chair that has side arms. You can use this for support while you get dressed.  Do not have throw rugs and other things on the floor that can make you trip. What can I do in the kitchen?  Clean up any spills right away.  Avoid walking on wet floors.  Keep items that you use a lot in easy-to-reach places.  If you need to reach something above you, use a strong step stool that has a grab bar.  Keep electrical cords out of the way.  Do not use floor polish or wax that makes floors slippery. If you must use  wax, use non-skid floor wax.  Do not have throw rugs and other things on the floor that can make you trip. What can I do with my stairs?  Do not leave any items on the stairs.  Make sure that there are handrails on both sides of the stairs and use them. Fix handrails that are broken or loose. Make sure that handrails are as long as the stairways.  Check any carpeting to make sure that it is firmly attached to the stairs. Fix any carpet that is loose or worn.  Avoid having throw rugs at the top or bottom of the stairs. If you do have throw rugs, attach them to the floor with carpet tape.  Make sure that you have a light switch at the top of the stairs and the bottom of the stairs. If you do not have them, ask someone to add them for you. What else can I do to help prevent falls?  Wear shoes that:  Do not have high heels.  Have rubber bottoms.  Are comfortable and fit you well.  Are closed at the toe. Do not wear sandals.  If  you use a stepladder:  Make sure that it is fully opened. Do not climb a closed stepladder.  Make sure that both sides of the stepladder are locked into place.  Ask someone to hold it for you, if possible.  Clearly mark and make sure that you can see:  Any grab bars or handrails.  First and last steps.  Where the edge of each step is.  Use tools that help you move around (mobility aids) if they are needed. These include:  Canes.  Walkers.  Scooters.  Crutches.  Turn on the lights when you go into a dark area. Replace any light bulbs as soon as they burn out.  Set up your furniture so you have a clear path. Avoid moving your furniture around.  If any of your floors are uneven, fix them.  If there are any pets around you, be aware of where they are.  Review your medicines with your doctor. Some medicines can make you feel dizzy. This can increase your chance of falling. Ask your doctor what other things that you can do to help prevent falls. This information is not intended to replace advice given to you by your health care provider. Make sure you discuss any questions you have with your health care provider. Document Released: 12/20/2008 Document Revised: 08/01/2015 Document Reviewed: 03/30/2014 Elsevier Interactive Patient Education  2017 [employer].

## 2019-05-17 ENCOUNTER — Ambulatory Visit: Payer: Self-pay | Admitting: Family Medicine

## 2019-05-17 ENCOUNTER — Ambulatory Visit: Payer: Medicare Other | Admitting: Family Medicine

## 2019-06-20 ENCOUNTER — Other Ambulatory Visit: Payer: Self-pay

## 2019-06-20 ENCOUNTER — Encounter: Payer: Self-pay | Admitting: Family Medicine

## 2019-06-20 ENCOUNTER — Telehealth (INDEPENDENT_AMBULATORY_CARE_PROVIDER_SITE_OTHER): Payer: Medicare Other | Admitting: Family Medicine

## 2019-06-20 ENCOUNTER — Telehealth: Payer: Self-pay | Admitting: Family Medicine

## 2019-06-20 DIAGNOSIS — G8929 Other chronic pain: Secondary | ICD-10-CM | POA: Diagnosis not present

## 2019-06-20 DIAGNOSIS — E785 Hyperlipidemia, unspecified: Secondary | ICD-10-CM | POA: Diagnosis not present

## 2019-06-20 DIAGNOSIS — M545 Low back pain: Secondary | ICD-10-CM

## 2019-06-20 MED ORDER — ROSUVASTATIN CALCIUM 5 MG PO TABS: ORAL_TABLET | ORAL | 1 refills | Status: DC

## 2019-06-20 MED ORDER — LIDOCAINE 5 % EX PTCH: 1.0000 | MEDICATED_PATCH | CUTANEOUS | 5 refills | Status: AC

## 2019-06-20 NOTE — Assessment & Plan Note (Signed)
Currently taking rosuvastatin 5mg  daily.  Labs due to be rechecked.    Plan: 1) Labs to be drawn before next visit in 3 months 2) Continue rosuvastatin 5mg  daily  3) Heart healthy diet and to exercise every other day for 30 minutes per day, going no more than 2 days in a row without exercise. 4) We will see you back in 3 months

## 2019-06-20 NOTE — Progress Notes (Signed)
06/20/2019- 119/70 06/15/2019- 143/71 06/13/2019- 145/78 

## 2019-06-20 NOTE — Telephone Encounter (Signed)
error 

## 2019-06-20 NOTE — Progress Notes (Signed)
Virtual Visit via Telephone  The purpose of this virtual visit is to provide medical care while limiting exposure to the novel coronavirus (COVID19) for both patient and office staff.  Consent was obtained for phone visit:  Yes.   Answered questions that patient had about telehealth interaction:  Yes.   I discussed the limitations, risks, security and privacy concerns of performing an evaluation and management service by telephone. I also discussed with the patient that there may be a patient responsible charge related to this service. The patient expressed understanding and agreed to proceed.  Patient is at home and is accessed via telephone Services are provided by Harlin Rain, FNP-C from [facility])  ---------------------------------------------------------------------- Chief Complaint  Patient presents with  . Hypertension  . Hyperlipidemia    pt complains of muscle pain since taking the Crestor. The patient take Pere(magnesium) to help with the muscle pain.     S: Reviewed CMA documentation. I have called patient and gathered additional HPI as follows:  Ms. Schaffer presents for telemedicine visit, requesting refills on her rosuvastatin and lidoderm patches.  Has continued to take her blood pressure intermittently and has found that she typically runs 110-120/70-80.  Denies any headaches, visual changes, lightheadedness, dizziness, chest pain, SOB, numbness, tingling, weakness.  Patient is currently home Denies any high risk travel to areas of current concern for COVID19. Denies any known or suspected exposure to person with or possibly with COVID19.  Admits  Denies any fevers, chills, sweats, body ache, cough, shortness of breath, sinus pain or pressure, headache, abdominal pain, diarrhea  Past Medical History:  Diagnosis Date  . Arthritis    hands  . Hyperlipidemia   . Hypertension    Social History   Tobacco Use  . Smoking status: Never  Smoker  . Smokeless tobacco: Never Used  Substance Use Topics  . Alcohol use: Yes    Alcohol/week: 4.0 standard drinks    Types: 4 Glasses of wine per week    Comment: glass white wine every evening   . Drug use: Never    Current Outpatient Medications:  .  aspirin 81 MG EC tablet, Take 81 mg by mouth daily. Swallow whole., Disp: , Rfl:  .  Cholecalciferol (VITAMIN D3 PO), Take 2,000 Units by mouth 2 (two) times a week., Disp: , Rfl:  .  lidocaine (LIDODERM) 5 %, Place 1 patch onto the skin daily. Remove & Discard patch within 12 hours or as directed by MD, Disp: 30 patch, Rfl: 5 .  MAGNESIUM PO, Take 150 mg by mouth daily., Disp: , Rfl:  .  rosuvastatin (CRESTOR) 5 MG tablet, TK 1 T PO QD FOR HIGH CHOLESTEROL, Disp: 90 tablet, Rfl: 1  Depression screen [facility] 2/9 05/16/2019 11/23/2018 11/08/2018  Decreased Interest 0 0 0  Down, Depressed, Hopeless 0 0 0  PHQ - 2 Score 0 0 0    No flowsheet data found.  -------------------------------------------------------------------------- O: No physical exam performed due to remote telephone encounter.  Physical Exam: Patient remotely monitored without video.  Verbal communication appropriate.  Cognition normal.  No results found for this or any previous visit (from the past 2160 hour(s)).  -------------------------------------------------------------------------- A&P:  Problem List Items Addressed This Visit      Other   Hyperlipidemia    Currently taking rosuvastatin 5mg  daily.  Labs due to be rechecked.    Plan: 1) Labs to be drawn before next visit in 3 months 2) Continue rosuvastatin 5mg  daily  3)  Heart healthy diet and to exercise every other day for 30 minutes per day, going no more than 2 days in a row without exercise. 4) We will see you back in 3 months      Relevant Medications   rosuvastatin (CRESTOR) 5 MG tablet    Other Visit Diagnoses    Chronic midline low back pain without sciatica       Relevant Medications    lidocaine (LIDODERM) 5 %      Meds ordered this encounter  Medications  . rosuvastatin (CRESTOR) 5 MG tablet    Sig: TK 1 T PO QD FOR HIGH CHOLESTEROL    Dispense:  90 tablet    Refill:  1  . lidocaine (LIDODERM) 5 %    Sig: Place 1 patch onto the skin daily. Remove & Discard patch within 12 hours or as directed by MD    Dispense:  30 patch    Refill:  5    Follow-up: - Return in 3 months for physical  Patient verbalizes understanding with the above medical recommendations including the limitation of remote medical advice.  Specific follow-up and call-back criteria were given for patient to follow-up or seek medical care more urgently if needed.   - Time spent in direct consultation with patient on phone: 12 minutes  Harlin Rain, [facility] 06/20/2019, 10:13 AM

## 2019-06-20 NOTE — Patient Instructions (Signed)
Try to get exercise a minimum of 30 minutes per day at least 5 days per week as well as  adequate water intake all while measuring blood pressure a few times per week.  Keep a blood pressure log and bring back to clinic at your next visit.  If your readings are consistently over 140/90 to contact our office/send me a MyChart message and we will see you sooner.  Can try DASH and Mediterranean diet options, avoiding processed foods, lowering sodium intake, avoiding pork products, and eating a plant based diet for optimal health.      Mediterranean Diet  Why follow it? Research shows. . Those who follow the Mediterranean diet have a reduced risk of heart disease  . The diet is associated with a reduced incidence of Parkinson's and Alzheimer's diseases . People following the diet may have longer life expectancies and lower rates of chronic diseases  . The Dietary Guidelines for Americans recommends the Mediterranean diet as an eating plan to promote health and prevent disease  What Is the Mediterranean Diet?  . Healthy eating plan based on typical foods and recipes of Mediterranean-style cooking . The diet is primarily a plant based diet; these foods should make up a majority of meals   Starches - Plant based foods should make up a majority of meals - They are an important sources of vitamins, minerals, energy, antioxidants, and fiber - Choose whole grains, foods high in fiber and minimally processed items  - Typical grain sources include wheat, oats, barley, corn, brown rice, bulgar, farro, millet, polenta, couscous  - Various types of beans include chickpeas, lentils, fava beans, black beans, white beans   Fruits  Veggies - Large quantities of antioxidant rich fruits & veggies; 6 or more servings  - Vegetables can be eaten raw or lightly drizzled with oil and cooked  - Vegetables common to the traditional Mediterranean Diet include: artichokes, arugula, beets, broccoli, brussel sprouts, cabbage,  carrots, celery, collard greens, cucumbers, eggplant, kale, leeks, lemons, lettuce, mushrooms, okra, onions, peas, peppers, potatoes, pumpkin, radishes, rutabaga, shallots, spinach, sweet potatoes, turnips, zucchini - Fruits common to the Mediterranean Diet include: apples, apricots, avocados, cherries, clementines, dates, figs, grapefruits, grapes, melons, nectarines, oranges, peaches, pears, pomegranates, strawberries, tangerines  Fats - Replace butter and margarine with healthy oils, such as olive oil, canola oil, and tahini  - Limit nuts to no more than a handful a day  - Nuts include walnuts, almonds, pecans, pistachios, pine nuts  - Limit or avoid candied, honey roasted or heavily salted nuts - Olives are central to the [employer] - can be eaten whole or used in a variety of dishes   Meats Protein - Limiting red meat: no more than a few times a month - When eating red meat: choose lean cuts and keep the portion to the size of deck of cards - Eggs: approx. 0 to 4 times a week  - Fish and lean poultry: at least 2 a week  - Healthy protein sources include, chicken, [place], lean beef, lamb - Increase intake of seafood such as tuna, salmon, trout, mackerel, shrimp, scallops - Avoid or limit high fat processed meats such as sausage and bacon  Dairy - Include moderate amounts of low fat dairy products  - Focus on healthy dairy such as fat free yogurt, skim milk, low or reduced fat cheese - Limit dairy products higher in fat such as whole or 2% milk, cheese, ice cream  Alcohol - Moderate amounts of  red wine is ok  - No more than 5 oz daily for women (all ages) and men older than age 75  - No more than 10 oz of wine daily for men younger than 27  Other - Limit sweets and other desserts  - Use herbs and spices instead of salt to flavor foods  - Herbs and spices common to the traditional Mediterranean Diet include: basil, bay leaves, chives, cloves, cumin, fennel, garlic, lavender, marjoram,  mint, oregano, parsley, pepper, rosemary, sage, savory, sumac, tarragon, thyme   It's not just a diet, it's a lifestyle:  . The Mediterranean diet includes lifestyle factors typical of those in the region  . Foods, drinks and meals are best eaten with others and savored . Daily physical activity is important for overall good health . This could be strenuous exercise like running and aerobics . This could also be more leisurely activities such as walking, housework, yard-work, or taking the stairs . Moderation is the key; a balanced and healthy diet accommodates most foods and drinks . Consider portion sizes and frequency of consumption of certain foods   Meal Ideas & Options:  . Breakfast:  o Whole wheat toast or whole wheat English muffins with peanut butter & hard boiled egg o Steel cut oats topped with apples & cinnamon and skim milk  o Fresh fruit: banana, strawberries, melon, berries, peaches  o Smoothies: strawberries, bananas, greek yogurt, peanut butter o Low fat greek yogurt with blueberries and granola  o Egg white omelet with spinach and mushrooms o Breakfast couscous: whole wheat couscous, apricots, skim milk, cranberries  . Sandwiches:  o Hummus and grilled vegetables (peppers, zucchini, squash) on whole wheat bread   o Grilled chicken on whole wheat pita with lettuce, tomatoes, cucumbers or tzatziki  o Tuna salad on whole wheat bread: tuna salad made with greek yogurt, olives, red peppers, capers, green onions o Garlic rosemary lamb pita: lamb sauted with garlic, rosemary, salt & pepper; add lettuce, cucumber, greek yogurt to pita - flavor with lemon juice and black pepper  . Seafood:  o Mediterranean grilled salmon, seasoned with garlic, basil, parsley, lemon juice and black pepper o Shrimp, lemon, and spinach whole-grain pasta salad made with low fat greek yogurt  o Seared scallops with lemon orzo  o Seared tuna steaks seasoned salt, pepper, coriander topped with tomato  mixture of olives, tomatoes, olive oil, minced garlic, parsley, green onions and cappers  . Meats:  o Herbed greek chicken salad with kalamata olives, cucumber, feta  o Red bell peppers stuffed with spinach, bulgur, lean ground beef (or lentils) & topped with feta   o Kebabs: skewers of chicken, tomatoes, onions, zucchini, squash  o [place] burgers: made with red onions, mint, dill, lemon juice, feta cheese topped with roasted red peppers . Vegetarian o Cucumber salad: cucumbers, artichoke hearts, celery, red onion, feta cheese, tossed in olive oil & lemon juice  o Hummus and whole grain pita points with a greek salad (lettuce, tomato, feta, olives, cucumbers, red onion) o Lentil soup with celery, carrots made with vegetable broth, garlic, salt and pepper  o Tabouli salad: parsley, bulgur, mint, scallions, cucumbers, tomato, radishes, lemon juice, olive oil, salt and pepper.  Howell Rucks

## 2020-01-16 ENCOUNTER — Telehealth: Payer: Self-pay

## 2020-01-16 ENCOUNTER — Other Ambulatory Visit: Payer: Self-pay | Admitting: Family Medicine

## 2020-01-16 DIAGNOSIS — E785 Hyperlipidemia, unspecified: Secondary | ICD-10-CM

## 2020-01-16 MED ORDER — ROSUVASTATIN CALCIUM 5 MG PO TABS: ORAL_TABLET | ORAL | 0 refills | Status: DC

## 2020-01-16 NOTE — Telephone Encounter (Signed)
Patient requesting a mammogram but has not been seen since 2020 - needs to be scheduled for a CPE. Please call patient to scheduled an appointment.  Thank you! Cassandra Cooley  Copied from [place] [id number]. Topic: Referral - Request for Referral >> Jan 16, 2020  9:32 AM Hinda Lenis D wrote: Has patient seen PCP for this complaint? YES  *If NO, is insurance requiring patient see PCP for this issue before PCP can refer them? Referral for which specialty: Annual Mammogram  Preferred provider/office: [facility] Reason for referral: Annual Mammogram

## 2020-01-16 NOTE — Telephone Encounter (Signed)
PT need refill  rosuvastatin (CRESTOR) 5 MG tablet [[id number]]  [id number] - [address], [place] AT [facility] OF [address]  [place] [address]  Phone: [phone] Fax: [phone]

## 2020-01-23 ENCOUNTER — Ambulatory Visit (INDEPENDENT_AMBULATORY_CARE_PROVIDER_SITE_OTHER): Payer: Medicare Other | Admitting: Family Medicine

## 2020-01-23 ENCOUNTER — Other Ambulatory Visit: Payer: Self-pay

## 2020-01-23 ENCOUNTER — Encounter: Payer: Self-pay | Admitting: Family Medicine

## 2020-01-23 VITALS — BP 152/75 | HR 68 | Temp 98.6°F | Resp 18 | Ht 65.5 in | Wt 145.0 lb

## 2020-01-23 DIAGNOSIS — Z1231 Encounter for screening mammogram for malignant neoplasm of breast: Secondary | ICD-10-CM | POA: Diagnosis not present

## 2020-01-23 DIAGNOSIS — E785 Hyperlipidemia, unspecified: Secondary | ICD-10-CM

## 2020-01-23 DIAGNOSIS — R635 Abnormal weight gain: Secondary | ICD-10-CM | POA: Diagnosis not present

## 2020-01-23 DIAGNOSIS — I10 Essential (primary) hypertension: Secondary | ICD-10-CM | POA: Insufficient documentation

## 2020-01-23 DIAGNOSIS — Z780 Asymptomatic menopausal state: Secondary | ICD-10-CM

## 2020-01-23 DIAGNOSIS — Z1211 Encounter for screening for malignant neoplasm of colon: Secondary | ICD-10-CM | POA: Insufficient documentation

## 2020-01-23 MED ORDER — LISINOPRIL 5 MG PO TABS: 5.0000 mg | ORAL_TABLET | Freq: Every day | ORAL | 3 refills | Status: AC

## 2020-01-23 MED ORDER — ROSUVASTATIN CALCIUM 5 MG PO TABS: ORAL_TABLET | ORAL | 3 refills | Status: AC

## 2020-01-23 NOTE — Assessment & Plan Note (Signed)
Pt postmenopausal w/out history of prior DEXA scan.    Plan: 1. Obtain DG bone density.

## 2020-01-23 NOTE — Assessment & Plan Note (Signed)
Pt last mammogram 01/12/2019.  Result BI-RADS 1 Negative.  Plan: 1. Screening mammogram order placed.  Pt will call to schedule appointment.  Information given.

## 2020-01-23 NOTE — Progress Notes (Addendum)
Subjective:    Patient ID: Cassandra Cooley, female    DOB: 1950-04-13, 69 y.o.   MRN: [id number]  Cassandra Cooley is a 69 y.o. female presenting on 01/23/2020 for Hypertension and Hyperlipidemia   HPI  Ms. Till presents to clinic for concerns of hypertension.  Reports history of hypertension in the past, has been off medication for approximately 6 months.  Has had some intermittent headaches and puffiness under her eyelids.  Denies visual changes, chest pain, palpitations, or leg swelling.  Reports when she was taking lisinopril in the past at 5mg  daily, her blood pressure was well controlled.  Depression screen [facility] 2/9 05/16/2019 11/23/2018 11/08/2018  Decreased Interest 0 0 0  Down, Depressed, Hopeless 0 0 0  PHQ - 2 Score 0 0 0    Social History   Tobacco Use  . Smoking status: Never Smoker  . Smokeless tobacco: Never Used  Vaping Use  . Vaping Use: Never used  Substance Use Topics  . Alcohol use: Yes    Alcohol/week: 3.0 standard drinks    Types: 3 Glasses of wine per week    Comment: glass white wine every evening   . Drug use: Never    Review of Systems  Constitutional: Negative.   HENT: Negative.   Eyes: Negative.        Puffiness under eyes  Respiratory: Negative.   Cardiovascular: Negative.   Gastrointestinal: Negative.   Endocrine: Negative.   Genitourinary: Negative.   Musculoskeletal: Negative.   Skin: Negative.   Allergic/Immunologic: Negative.   Neurological: Positive for headaches. Negative for dizziness, tremors, seizures, syncope, facial asymmetry, speech difficulty, weakness, light-headedness and numbness.  Hematological: Negative.   Psychiatric/Behavioral: Negative.    Per HPI unless specifically indicated above     Objective:    BP (!) 152/75 (BP Location: Left Arm, Patient Position: Sitting, Cuff Size: Normal)   Pulse 68   Temp 98.6 F (37 C) (Oral)   Resp 18   Ht 5' 5.5" (1.664 m)   Wt 145 lb (65.8 kg)   SpO2 100%   BMI 23.76 kg/m   Wt  Readings from Last 3 Encounters:  01/23/20 145 lb (65.8 kg)  05/16/19 135 lb (61.2 kg)  11/23/18 136 lb 6.4 oz (61.9 kg)    Physical Exam Vitals and nursing note reviewed.  Constitutional:      General: She is not in acute distress.    Appearance: Normal appearance. She is well-developed, well-groomed and normal weight. She is not ill-appearing or toxic-appearing.  HENT:     Head: Normocephalic and atraumatic.     Right Ear: Tympanic membrane, ear canal and external ear normal. There is no impacted cerumen.     Left Ear: Tympanic membrane, ear canal and external ear normal. There is no impacted cerumen.     Nose: Nose normal. No congestion or rhinorrhea.     Comments: Cassandra Cooley is in place, covering mouth and nose.    Mouth/Throat:     Lips: Pink.     Mouth: Mucous membranes are moist.     Pharynx: Oropharynx is clear. Uvula midline. No oropharyngeal exudate or posterior oropharyngeal erythema.      Comments: Palate mass, patient reports longstanding history without change. Eyes:     General: Lids are normal. Vision grossly intact. No scleral icterus.       Right eye: No discharge.        Left eye: No discharge.     Extraocular Movements: Extraocular movements intact.  Conjunctiva/sclera: Conjunctivae normal.     Pupils: Pupils are equal, round, and reactive to light.  Neck:     Thyroid: No thyroid mass or thyromegaly.  Cardiovascular:     Rate and Rhythm: Normal rate and regular rhythm.     Pulses: Normal pulses.          Dorsalis pedis pulses are 2+ on the right side and 2+ on the left side.     Heart sounds: Normal heart sounds. No murmur heard.  No friction rub. No gallop.   Pulmonary:     Effort: Pulmonary effort is normal. No respiratory distress.     Breath sounds: Normal breath sounds.  Abdominal:     General: Abdomen is flat. Bowel sounds are normal. There is no distension.     Palpations: Abdomen is soft. There is no hepatomegaly, splenomegaly or mass.      Tenderness: There is no abdominal tenderness. There is no guarding or rebound.     Hernia: No hernia is present.  Musculoskeletal:        General: Normal range of motion.     Cervical back: Normal range of motion and neck supple. No tenderness.     Right lower leg: No edema.     Left lower leg: No edema.     Comments: Normal tone, strength 5/5 BUE & BLE  Feet:     Right foot:     Skin integrity: Skin integrity normal.     Left foot:     Skin integrity: Skin integrity normal.  Lymphadenopathy:     Cervical: No cervical adenopathy.  Skin:    General: Skin is warm and dry.     Capillary Refill: Capillary refill takes less than 2 seconds.  Neurological:     General: No focal deficit present.     Mental Status: She is alert and oriented to person, place, and time.     Cranial Nerves: No cranial nerve deficit.     Sensory: No sensory deficit.     Motor: No weakness.     Coordination: Coordination normal.     Gait: Gait normal.     Deep Tendon Reflexes: Reflexes normal.  Psychiatric:        Attention and Perception: Attention and perception normal.        Mood and Affect: Mood and affect normal.        Speech: Speech normal.        Behavior: Behavior normal. Behavior is cooperative.        Thought Content: Thought content normal.        Cognition and Memory: Cognition and memory normal.        Judgment: Judgment normal.    Results for orders placed or performed in visit on 01/23/20  CBC with Differential  Result Value Ref Range   WBC 5.4 3.8 - 10.8 Thousand/uL   RBC 4.08 3.80 - 5.10 Million/uL   Hemoglobin 12.6 11.7 - 15.5 g/dL   HCT 38.0 35 - 45 %   MCV 93.1 80.0 - 100.0 fL   MCH 30.9 27.0 - 33.0 pg   MCHC 33.2 32.0 - 36.0 g/dL   RDW 13.0 11.0 - 15.0 %   Platelets 304 140 - 400 Thousand/uL   MPV 11.0 7.5 - 12.5 fL   Neutro Abs 3,224 1,500 - 7,800 cells/uL   Lymphs Abs 1,593 850 - 3,900 cells/uL   Absolute Monocytes 416 200 - 950 cells/uL   Eosinophils Absolute 130 15.0  -  500.0 cells/uL   Basophils Absolute 38 0.0 - 200.0 cells/uL   Neutrophils Relative % 59.7 %   Total Lymphocyte 29.5 %   Monocytes Relative 7.7 %   Eosinophils Relative 2.4 %   Basophils Relative 0.7 %  COMPLETE METABOLIC PANEL WITH GFR  Result Value Ref Range   Glucose, Bld 87 65 - 99 mg/dL   BUN 15 7 - 25 mg/dL   Creat 1.14 (H) 0.60 - 0.93 mg/dL   GFR, Est Non African American 48 (L) > OR = 60 mL/min/1.28m2   GFR, Est African American 56 (L) > OR = 60 mL/min/1.83m2   BUN/Creatinine Ratio 13 6 - 22 (calc)   Sodium 141 135 - 146 mmol/L   Potassium 4.2 3.5 - 5.3 mmol/L   Chloride 105 98 - 110 mmol/L   CO2 26 20 - 32 mmol/L   Calcium 10.5 (H) 8.6 - 10.4 mg/dL   Total Protein 7.9 6.1 - 8.1 g/dL   Albumin 4.8 3.6 - 5.1 g/dL   Globulin 3.1 1.9 - 3.7 g/dL (calc)   AG Ratio 1.5 1.0 - 2.5 (calc)   Total Bilirubin 0.5 0.2 - 1.2 mg/dL   Alkaline phosphatase (APISO) 94 37 - 153 U/L   AST 18 10 - 35 U/L   ALT 14 6 - 29 U/L  Lipid Profile  Result Value Ref Range   Cholesterol 244 (H) <200 mg/dL   HDL 130 > OR = 50 mg/dL   Triglycerides 57 <150 mg/dL   LDL Cholesterol (Calc) 100 (H) mg/dL (calc)   Total CHOL/HDL Ratio 1.9 <5.0 (calc)   Non-HDL Cholesterol (Calc) 114 <130 mg/dL (calc)  TSH + free T4  Result Value Ref Range   TSH W/REFLEX TO FT4 0.76 0.40 - 4.50 mIU/L      Assessment & Plan:   Problem List Items Addressed This Visit      Cardiovascular and Mediastinum   Hypertension - Primary    Uncontrolled hypertension.  BP is not at goal < 130/80.  Pt is working on lifestyle modifications.  Taking medications tolerating well without side effects.  Complications:  HLD  Plan: 1. RESTART on lisinopril 5mg  daily 2. Obtain labs today  3. Encouraged heart healthy diet and increasing exercise to 30 minutes most days of the week, going no more than 2 days in a row without exercise. 4. Check BP 1-2 x per week at home, keep log, and bring to clinic at next appointment. 5. Follow up 3  weeks.       Relevant Medications   lisinopril (ZESTRIL) 5 MG tablet   rosuvastatin (CRESTOR) 5 MG tablet   Other Relevant Orders   CBC with Differential (Completed)   COMPLETE METABOLIC PANEL WITH GFR (Completed)     Other   Hyperlipidemia    Status unknown.  Recheck labs.  Continue meds without changes today.  Refills provided. Followup after labs.       Relevant Medications   lisinopril (ZESTRIL) 5 MG tablet   rosuvastatin (CRESTOR) 5 MG tablet   Other Relevant Orders   Lipid Profile (Completed)   Encounter for screening mammogram for malignant neoplasm of breast    Pt last mammogram 01/12/2019.  Result BI-RADS 1 Negative.  Plan: 1. Screening mammogram order placed.  Pt will call to schedule appointment.  Information given.       Relevant Orders   MM 3D SCREEN BREAST BILATERAL   Postmenopausal    Pt postmenopausal w/out history of prior DEXA scan.  Plan: 1. Obtain DG bone density.         Relevant Orders   DG Bone Density   Colon cancer screening    Pt requiring colon cancer screening.  Denies family history of colon cancer.  Plan: - Discussed timing for initiation of colon cancer screening ACS vs USPSTF guidelines - Mutual decision making discussion for options of colonoscopy vs cologuard.  Pt prefers cologuard. - Ordered Cologuard today      Relevant Orders   Cologuard    Other Visit Diagnoses    Weight gain       Relevant Orders   TSH + free T4 (Completed)      Meds ordered this encounter  Medications  . lisinopril (ZESTRIL) 5 MG tablet    Sig: Take 1 tablet (5 mg total) by mouth daily.    Dispense:  90 tablet    Refill:  3  . rosuvastatin (CRESTOR) 5 MG tablet    Sig: TK 1 T PO QD FOR HIGH CHOLESTEROL    Dispense:  90 tablet    Refill:  3    Follow up plan: Return in about 3 weeks (around 02/13/2020) for HTN F/U.   Harlin Rain, [facility] Nurse Practitioner [facility] 02/05/2020,  12:49 PM

## 2020-01-23 NOTE — Patient Instructions (Addendum)
OPTIONS FOR DENTAL FOLLOW UP CARE   No insurance or limited coverage / Most offer flat fee or sliding scale payment plan   [place], [place] - [address] [phone] [facility] is a Environmental consultant. Their mission is to provide exceptional, gentle dental care to our patients at the highest quality in a friendly and caring environment. They provide a wide range of services including but not limited to routine dental care, extractions, root   [place] 23 miles away from [place]. [place], [place] - [address] [phone] [facility]: 23 miles from [place] Exams:Includes x-rays and developing a treatment plan.Basic and Hygiene Services:.Fillings.Extractions.Dentures.Partials.Crowns (Caps).Cleanings - Standard     Deep Cleanings.Sealants.Fluoride treatments.Basic Oral Cancer ScreeningsSliding Fees Scale av email website See Clinic Full Details  [facility] 33 miles away from [place], [place] - [address] [phone] [facility]: 33 miles from [place] including: Cleanings Fillings Extractions Dentures Partials All ages welcome.   [place]   Address: [place], [place], [place] [address] Hours: Tues 4:15PM-8PM // Weds 9am - 12pm // Thurs 1pm - 8pm (Closed other days) Phone: [phone]    [facility] [phone] Select option 1 for emergencies   Location: [facility], [place], [place], [place] [facility] Hours: No walk-ins accepted - call the day before to schedule an appointment. Check in times are 9:30 am and 1:30 pm. Services: Simple extractions, temporary fillings, pulpectomy/pulp debridement, uncomplicated abscess drainage. Payment Options: PAYMENT IS  DUE AT THE TIME OF SERVICE.  Fee is usually $100-200, additional surgical procedures (e.g. abscess drainage) may be extra. Cash, checks, Visa/MasterCard accepted.  Can file Medicaid if patient is covered for dental - patient should call case worker to check. No discount for [facility] patients. Best way to get seen: MUST call the day before and get onto the schedule. Can usually be seen the next 1-2 days. No walk-ins accepted.    [facility] [phone]) This clinic caters to the indigent population and is on a lottery system. Location: [employer] [facility], [place], [place], [place] Clinic Hours: Wednesdays from 6pm - 9pm, patients seen by a lottery system. For dates, call or go to [email or web address] Services: Cleanings, fillings and simple extractions. Payment Options: DENTAL WORK IS FREE OF CHARGE. Bring proof of income or support. Best way to get seen: Arrive at 5:15 pm - this is a lottery, NOT first come/first serve, so arriving earlier will not increase your chances of being seen.     [place] [facility] and [place] [employer] [phone])  [facility] [phone])  [place] [phone] ext [phone])         [facility] [phone]    Location: [place], [place] [facility] Hours: M, W, Th, F 8am or 1:30pm, Tues 9a or 1:30 - first come/first served. Services: Simple extractions, temporary fillings, uncomplicated abscess drainage.  You do not need to be an [facility] resident. Payment Options: PAYMENT IS DUE AT THE TIME OF SERVICE. Dental insurance, otherwise sliding scale - bring proof of income or support. Depending on income and treatment needed, cost is usually $50-200. Best way to  get seen: Arrive early as it  is first come/first served.     [facility] [phone]    Location: [facility] Hours: Mon-Thu 8a-5p Services: Most basic dental services including extractions and fillings. Payment Options: PAYMENT IS DUE AT THE TIME OF SERVICE. Sliding scale, up to 50% off - bring proof if income or support. Medicaid with dental option accepted. Best way to get seen: Call to schedule an appointment, can usually be seen within 2 weeks OR they will try to see walk-ins - show up at [place] or 2p (you may have to wait).       [facility] [place] RESIDENTS ONLY    Location: [facility], [place] [address] [place], [place] [address] Clinic Hours: By appointment only. Monday - Thursday 8am-5pm, Friday 8am-12pm Services: Cleanings, fillings, extractions. Payment Options: PAYMENT IS DUE AT THE TIME OF SERVICE. Cash, Visa or MasterCard. Sliding scale - $30 minimum per service. Best way to get seen: Come in to office, complete packet and make an appointment - need proof of income or support monies for each household member and proof of [facility] residence. Usually takes about a month to get in.     [facility] [phone]    Location: [address] [place] [facility] Hours: Walk-in Urgent Care Dental Services are offered Monday-Friday mornings only. The numbers of emergencies accepted daily is limited to the number of providers available. Maximum 15 - Mondays, Wednesdays & Thursdays Maximum 10 - Tuesdays & Fridays Services: You do not need to be a [facility] resident to be seen for a dental emergency. Emergencies are defined as pain, swelling, abnormal bleeding, or dental trauma. Walkins will receive x-rays if needed. NOTE: Dental cleaning is not an emergency. Payment Options: PAYMENT IS DUE AT THE TIME OF SERVICE. Minimum co-pay is $40.00 for  uninsured patients. Minimum co-pay is $3.00 for Medicaid with dental coverage. Dental Insurance is accepted and must be presented at time of visit. Medicare does not cover dental. Forms of payment: Cash, credit card, checks. Best way to get seen: If not previously registered with the clinic, walk-in dental registration begins at 7:15 am and is on a first come/first serve basis. If previously registered with the clinic, call to make an appointment.    For children / young adults - age 56 to 49 / pregnant patients  [facility] 20 miles away from [place], [place] - [address] [phone] [facility]: 20 miles from [facility]. [facility]. Provides exams, treatment, cleanings, and emergency care for financially eligible children. They take children up to age 70 who are enrolled in Medicaid or [place]; pregnant women with a Medicaid  [place], [place] [address] [phone] [facility] offers cleaning, fluoride treatment, infant oral care, tooth brushing/flossing instruction, nutrition counseling, sealants, fillings, crowns, extractions and emergency treatment for children 93-63 years of age.Charges are based on family income.&nbs    Have your labs drawn and we will contact you with the results  Well Visit: Care Instructions Overview  Well visits can help you stay healthy. Your provider has checked your overall health and may have suggested ways to take good care of yourself. Your provider also may have recommended tests. At home,  you can help prevent illness with healthy eating, regular exercise, and other steps.  Follow-up care is a key part of your treatment and safety. Be sure to make and go to all  appointments, and call your provider if you are having problems. It's also a good idea to know your test results and keep a list of the medicines you take.  How can you care for yourself at home?   Get screening tests that you and your doctor decide on. Screening helps find diseases before any symptoms appear.   Eat healthy foods. Choose fruits, vegetables, whole grains, protein, and low-fat dairy foods. Limit fat, especially saturated fat. Reduce salt in your diet.   Limit alcohol. If you are a man, have no more than 2 drinks a day or 14 drinks a week. If you are a woman, have no more than 1 drink a day or 7 drinks a week.   Get at least 30 minutes of physical activity on most days of the week.  We recommend you go no more than 2 days in a row without exercise. Walking is a good choice. You also may want to do other activities, such as running, swimming, cycling, or playing tennis or team sports. Discuss any changes in your exercise program with your provider.   Reach and stay at a healthy weight. This will lower your risk for many problems, such as obesity, diabetes, heart disease, and high blood pressure.   Do not smoke or allow others to smoke around you. If you need help quitting, talk to your provider about stop-smoking programs and medicines. These can increase your chances of quitting for good.  Can call 1-800-QUIT-NOW 539-155-1846) for the [facility], assistance with smoking cessation.   Care for your mental health. It is easy to get weighed down by worry and stress. Learn strategies to manage stress, like deep breathing and mindfulness, and stay connected with your family and community. If you find you often feel sad or hopeless, talk with your provider. Treatment can help.   Talk to your provider about whether you have any risk factors for sexually transmitted infections (STIs). You can help prevent STIs if you wait to have sex with a new partner (or partners)  until you've each been tested for STIs. It also helps if you use condoms (female or female condoms) and if you limit your sex partners to one person who only has sex with you. Vaccines are available for some STIs, such as HPV (these are age dependent).   If you think you may have a problem with alcohol or drug use, talk to your provider. This includes prescription medicines (such as amphetamines and opioids) and illegal drugs (such as cocaine and methamphetamine). Your provider can help you figure out what type of treatment is best for you.   If you have concerns about domestic violence or intimate partner violence, there are resources available to you. National Domestic Abuse Hotline 437-823-2266   Protect your skin from too much sun. When you're outdoors from 10 a.m. to 4 p.m., stay in the shade or cover up with clothing and a hat with a wide brim. Wear sunglasses that block UV rays. Even when it's cloudy, put broad-spectrum sunscreen (SPF 30 or higher) on any exposed skin.   See a dentist one or two times a year for checkups and to have your teeth cleaned.   See an eye doctor once per year for an eye exam.   Wear a seat  belt in the car.  When should you call for help?  Watch closely for changes in your health, and be sure to contact your provider if you have any problems or symptoms that concern you.  We will plan to see you back in 3 weeks for hypertension follow up visit  You will receive a survey after today's visit either digitally by e-mail or paper by [place] mail. Your experiences and feedback matter to [place].  Please respond so we know how we are doing as we provide care for you.  Call us with any questions/concerns/needs.  It is my goal to be available to you for your health concerns.  Thanks for choosing me to be a partner in your healthcare needs!  Harlin Rain, FNP-C Family Nurse Practitioner [facility] Phone: [phone]

## 2020-01-23 NOTE — Assessment & Plan Note (Signed)
Pt requiring colon cancer screening.  Denies family history of colon cancer.  Plan: - Discussed timing for initiation of colon cancer screening ACS vs USPSTF guidelines - Mutual decision making discussion for options of colonoscopy vs cologuard.  Pt prefers cologuard. - Ordered Cologuard today

## 2020-01-23 NOTE — Assessment & Plan Note (Signed)
Uncontrolled hypertension.  BP is not at goal < 130/80.  Pt is working on lifestyle modifications.  Taking medications tolerating well without side effects.  Complications:  HLD  Plan: 1. RESTART on lisinopril 5mg  daily 2. Obtain labs today  3. Encouraged heart healthy diet and increasing exercise to 30 minutes most days of the week, going no more than 2 days in a row without exercise. 4. Check BP 1-2 x per week at home, keep log, and bring to clinic at next appointment. 5. Follow up 3 weeks.

## 2020-01-23 NOTE — Assessment & Plan Note (Signed)
Status unknown.  Recheck labs.  Continue meds without changes today.  Refills provided. Followup after labs.  

## 2020-01-24 LAB — CBC WITH DIFFERENTIAL/PLATELET
Absolute Monocytes: 416 cells/uL (ref 200–950)
Basophils Absolute: 38 cells/uL (ref 0–200)
Basophils Relative: 0.7 %
Eosinophils Absolute: 130 cells/uL (ref 15–500)
Eosinophils Relative: 2.4 %
HCT: 38 % (ref 35.0–45.0)
Hemoglobin: 12.6 g/dL (ref 11.7–15.5)
Lymphs Abs: 1593 cells/uL (ref 850–3900)
MCH: 30.9 pg (ref 27.0–33.0)
MCHC: 33.2 g/dL (ref 32.0–36.0)
MCV: 93.1 fL (ref 80.0–100.0)
MPV: 11 fL (ref 7.5–12.5)
Monocytes Relative: 7.7 %
Neutro Abs: 3224 cells/uL (ref 1500–7800)
Neutrophils Relative %: 59.7 %
Platelets: 304 10*3/uL (ref 140–400)
RBC: 4.08 10*6/uL (ref 3.80–5.10)
RDW: 13 % (ref 11.0–15.0)
Total Lymphocyte: 29.5 %
WBC: 5.4 10*3/uL (ref 3.8–10.8)

## 2020-01-24 LAB — COMPLETE METABOLIC PANEL WITH GFR
AG Ratio: 1.5 (calc) (ref 1.0–2.5)
ALT: 14 U/L (ref 6–29)
AST: 18 U/L (ref 10–35)
Albumin: 4.8 g/dL (ref 3.6–5.1)
Alkaline phosphatase (APISO): 94 U/L (ref 37–153)
BUN/Creatinine Ratio: 13 (calc) (ref 6–22)
BUN: 15 mg/dL (ref 7–25)
CO2: 26 mmol/L (ref 20–32)
Calcium: 10.5 mg/dL — ABNORMAL HIGH (ref 8.6–10.4)
Chloride: 105 mmol/L (ref 98–110)
Creat: 1.14 mg/dL — ABNORMAL HIGH (ref 0.60–0.93)
GFR, Est African American: 56 mL/min/{1.73_m2} — ABNORMAL LOW (ref >=60)
GFR, Est Non African American: 48 mL/min/{1.73_m2} — ABNORMAL LOW (ref >=60)
Globulin: 3.1 g/dL (calc) (ref 1.9–3.7)
Glucose, Bld: 87 mg/dL (ref 65–99)
Potassium: 4.2 mmol/L (ref 3.5–5.3)
Sodium: 141 mmol/L (ref 135–146)
Total Bilirubin: 0.5 mg/dL (ref 0.2–1.2)
Total Protein: 7.9 g/dL (ref 6.1–8.1)

## 2020-01-24 LAB — TSH+FREE T4: TSH W/REFLEX TO FT4: 0.76 mIU/L (ref 0.40–4.50)

## 2020-01-24 LAB — LIPID PANEL
Cholesterol: 244 mg/dL — ABNORMAL HIGH (ref <200)
HDL: 130 mg/dL (ref >=50)
LDL Cholesterol (Calc): 100 mg/dL (calc) — ABNORMAL HIGH
Non-HDL Cholesterol (Calc): 114 mg/dL (calc) (ref <130)
Total CHOL/HDL Ratio: 1.9 (calc) (ref <5.0)
Triglycerides: 57 mg/dL (ref <150)

## 2020-02-05 NOTE — Addendum Note (Signed)
Addended by: Verl Bangs on: 02/05/2020 12:49 PM   Modules accepted: Level of Service

## 2020-03-22 LAB — COLOGUARD: Cologuard: POSITIVE — AB

## 2020-04-01 LAB — COLOGUARD: COLOGUARD: POSITIVE — AB

## 2020-04-03 ENCOUNTER — Other Ambulatory Visit: Payer: Self-pay | Admitting: Family Medicine

## 2020-04-03 ENCOUNTER — Telehealth: Payer: Self-pay

## 2020-04-03 DIAGNOSIS — R195 Other fecal abnormalities: Secondary | ICD-10-CM

## 2020-04-03 NOTE — Telephone Encounter (Signed)
Telephone call to patient, provided update on positive cologuard results.  Patient verbalized understanding.  Referral placed to gastroenterology for colonoscopy and follow up.

## 2020-04-03 NOTE — Telephone Encounter (Signed)
Copied from [place] [phone]. Topic: General - Inquiry >> Apr 03, 2020 11:24 AM Scherrie Gerlach wrote: Reason for CRM: exact science says they faxed an abnormal colo guard result this am.  Please be on look out.

## 2020-04-16 ENCOUNTER — Other Ambulatory Visit: Payer: Self-pay

## 2020-04-16 ENCOUNTER — Ambulatory Visit
Admission: RE | Admit: 2020-04-16 | Discharge: 2020-04-16 | Disposition: A | Discharge status: Home / Self Care | Payer: Medicare Other | Source: Ambulatory Visit | Attending: Family Medicine | Admitting: Family Medicine

## 2020-04-16 DIAGNOSIS — Z1231 Encounter for screening mammogram for malignant neoplasm of breast: Secondary | ICD-10-CM | POA: Diagnosis present

## 2020-04-22 ENCOUNTER — Telehealth (INDEPENDENT_AMBULATORY_CARE_PROVIDER_SITE_OTHER): Payer: Self-pay | Admitting: Gastroenterology

## 2020-04-22 ENCOUNTER — Other Ambulatory Visit: Payer: Self-pay

## 2020-04-22 DIAGNOSIS — R195 Other fecal abnormalities: Secondary | ICD-10-CM

## 2020-04-22 MED ORDER — NA SULFATE-K SULFATE-MG SULF 17.5-3.13-1.6 GM/177ML PO SOLN: 1.0000 | Freq: Once | ORAL | 0 refills | Status: AC

## 2020-04-22 NOTE — Progress Notes (Signed)
Gastroenterology Pre-Procedure Review  Request Date: Tuesday 04/30/20 Requesting Physician: Dr. Bonna Gains  PATIENT REVIEW QUESTIONS: The patient responded to the following health history questions as indicated:    1. Are you having any GI issues? no referral is for positive colorectal screening 2. Do you have a personal history of Polyps? no 3. Do you have a family history of Colon Cancer or Polyps? no 4. Diabetes Mellitus? no 5. Joint replacements in the past 12 months?no 6. Major health problems in the past 3 months?no 7. Any artificial heart valves, MVP, or defibrillator?no    MEDICATIONS & ALLERGIES:    Patient reports the following regarding taking any anticoagulation/antiplatelet therapy:   Plavix, Coumadin, Eliquis, Xarelto, Lovenox, Pradaxa, Brilinta, or Effient? no Aspirin? no  Patient confirms/reports the following medications:  Current Outpatient Medications  Medication Sig Dispense Refill  . aspirin 81 MG EC tablet Take 81 mg by mouth daily. Swallow whole.    . Cholecalciferol (VITAMIN D3 PO) Take 2,000 Units by mouth 2 (two) times a week.    . lidocaine (LIDODERM) 5 % Place 1 patch onto the skin daily. Remove & Discard patch within 12 hours or as directed by MD 30 patch 5  . lisinopril (ZESTRIL) 5 MG tablet Take 1 tablet (5 mg total) by mouth daily. 90 tablet 3  . MAGNESIUM PO Take 150 mg by mouth daily. (Patient not taking: Reported on 01/23/2020)    . rosuvastatin (CRESTOR) 5 MG tablet TK 1 T PO QD FOR HIGH CHOLESTEROL 90 tablet 3   No current facility-administered medications for this visit.    Patient confirms/reports the following allergies:  Allergies  Allergen Reactions  . Shellfish Allergy Swelling    hives    No orders of the defined types were placed in this encounter.   AUTHORIZATION INFORMATION Primary Insurance: 1D#: Group #:  Secondary Insurance: 1D#: Group #:  SCHEDULE INFORMATION: Date: 04/30/20 Time: Location:[facility]

## 2020-04-22 NOTE — Progress Notes (Signed)
Gastroenterology Pre-Procedure Review  Request Date: 04/30/20 Requesting Physician: Dr. Bonna Gains  PATIENT REVIEW QUESTIONS: The patient responded to the following health history questions as indicated:    1. Are you having any GI issues? no Positive colorectal test 2. Do you have a personal history of Polyps? no 3. Do you have a family history of Colon Cancer or Polyps? yes (3rd or 4th maternal cousin colon cancer) 4. Diabetes Mellitus? no 5. Joint replacements in the past 12 months?no 6. Major health problems in the past 3 months?no 7. Any artificial heart valves, MVP, or defibrillator?no    MEDICATIONS & ALLERGIES:    Patient reports the following regarding taking any anticoagulation/antiplatelet therapy:   Plavix, Coumadin, Eliquis, Xarelto, Lovenox, Pradaxa, Brilinta, or Effient? no Aspirin? Yes 81 mg daily  Patient confirms/reports the following medications:  Current Outpatient Medications  Medication Sig Dispense Refill   aspirin 81 MG EC tablet Take 81 mg by mouth daily. Swallow whole.     Cholecalciferol (VITAMIN D3 PO) Take 2,000 Units by mouth 2 (two) times a week.     lidocaine (LIDODERM) 5 % Place 1 patch onto the skin daily. Remove & Discard patch within 12 hours or as directed by MD 30 patch 5   lisinopril (ZESTRIL) 5 MG tablet Take 1 tablet (5 mg total) by mouth daily. 90 tablet 3   MAGNESIUM PO Take 150 mg by mouth daily. (Patient not taking: Reported on 01/23/2020)     Na Sulfate-K Sulfate-Mg Sulf 17.5-3.13-1.6 GM/177ML SOLN Take 1 kit by mouth once for 1 dose. 354 mL 0   rosuvastatin (CRESTOR) 5 MG tablet TK 1 T PO QD FOR HIGH CHOLESTEROL 90 tablet 3   No current facility-administered medications for this visit.    Patient confirms/reports the following allergies:  Allergies  Allergen Reactions   Shellfish Allergy Swelling    hives    No orders of the defined types were placed in this encounter.   AUTHORIZATION INFORMATION Primary  Insurance: 1D#: Group #:  Secondary Insurance: 1D#: Group #:  SCHEDULE INFORMATION: Date: 04/30/20 Time: Location: [place]

## 2020-04-24 ENCOUNTER — Encounter: Payer: Self-pay | Admitting: Gastroenterology

## 2020-04-24 ENCOUNTER — Other Ambulatory Visit: Payer: Self-pay

## 2020-04-24 ENCOUNTER — Other Ambulatory Visit: Payer: Medicare Other

## 2020-04-24 DIAGNOSIS — N289 Disorder of kidney and ureter, unspecified: Secondary | ICD-10-CM

## 2020-04-25 LAB — COMPREHENSIVE METABOLIC PANEL
AG Ratio: 1.7 (calc) (ref 1.0–2.5)
ALT: 11 U/L (ref 6–29)
AST: 17 U/L (ref 10–35)
Albumin: 4.7 g/dL (ref 3.6–5.1)
Alkaline phosphatase (APISO): 82 U/L (ref 37–153)
BUN/Creatinine Ratio: 18 (calc) (ref 6–22)
BUN: 19 mg/dL (ref 7–25)
CO2: 24 mmol/L (ref 20–32)
Calcium: 9.8 mg/dL (ref 8.6–10.4)
Chloride: 106 mmol/L (ref 98–110)
Creat: 1.08 mg/dL — ABNORMAL HIGH (ref 0.60–0.93)
Globulin: 2.8 g/dL (calc) (ref 1.9–3.7)
Glucose, Bld: 84 mg/dL (ref 65–99)
Potassium: 4.5 mmol/L (ref 3.5–5.3)
Sodium: 140 mmol/L (ref 135–146)
Total Bilirubin: 0.7 mg/dL (ref 0.2–1.2)
Total Protein: 7.5 g/dL (ref 6.1–8.1)

## 2020-04-26 ENCOUNTER — Other Ambulatory Visit: Payer: Self-pay

## 2020-04-26 ENCOUNTER — Other Ambulatory Visit
Admission: RE | Admit: 2020-04-26 | Discharge: 2020-04-26 | Disposition: A | Discharge status: Home / Self Care | Payer: Medicare Other | Source: Ambulatory Visit | Attending: Gastroenterology | Admitting: Gastroenterology

## 2020-04-26 DIAGNOSIS — Z20822 Contact with and (suspected) exposure to covid-19: Secondary | ICD-10-CM | POA: Insufficient documentation

## 2020-04-26 DIAGNOSIS — Z01812 Encounter for preprocedural laboratory examination: Secondary | ICD-10-CM | POA: Diagnosis present

## 2020-04-26 LAB — SARS CORONAVIRUS 2 (TAT 6-24 HRS): SARS Coronavirus 2: NEGATIVE

## 2020-04-29 NOTE — Discharge Instructions (Signed)

## 2020-04-30 ENCOUNTER — Encounter
Admission: RE | Disposition: A | Discharge status: Home / Self Care | Payer: Self-pay | Source: Home / Self Care | Attending: Gastroenterology

## 2020-04-30 ENCOUNTER — Ambulatory Visit
Admission: RE | Admit: 2020-04-30 | Discharge: 2020-04-30 | Disposition: A | Discharge status: Home / Self Care | Payer: Medicare Other | Attending: Gastroenterology | Admitting: Gastroenterology

## 2020-04-30 ENCOUNTER — Ambulatory Visit: Payer: Medicare Other | Admitting: Anesthesiology

## 2020-04-30 ENCOUNTER — Other Ambulatory Visit: Payer: Self-pay

## 2020-04-30 ENCOUNTER — Encounter: Payer: Self-pay | Admitting: Gastroenterology

## 2020-04-30 DIAGNOSIS — R195 Other fecal abnormalities: Secondary | ICD-10-CM

## 2020-04-30 DIAGNOSIS — Z7982 Long term (current) use of aspirin: Secondary | ICD-10-CM | POA: Diagnosis not present

## 2020-04-30 DIAGNOSIS — D125 Benign neoplasm of sigmoid colon: Secondary | ICD-10-CM | POA: Diagnosis not present

## 2020-04-30 DIAGNOSIS — K635 Polyp of colon: Secondary | ICD-10-CM | POA: Diagnosis not present

## 2020-04-30 DIAGNOSIS — I10 Essential (primary) hypertension: Secondary | ICD-10-CM | POA: Diagnosis not present

## 2020-04-30 DIAGNOSIS — Z8249 Family history of ischemic heart disease and other diseases of the circulatory system: Secondary | ICD-10-CM | POA: Diagnosis not present

## 2020-04-30 DIAGNOSIS — D122 Benign neoplasm of ascending colon: Secondary | ICD-10-CM | POA: Diagnosis not present

## 2020-04-30 DIAGNOSIS — K5730 Diverticulosis of large intestine without perforation or abscess without bleeding: Secondary | ICD-10-CM | POA: Insufficient documentation

## 2020-04-30 DIAGNOSIS — D120 Benign neoplasm of cecum: Secondary | ICD-10-CM | POA: Diagnosis not present

## 2020-04-30 DIAGNOSIS — Z79899 Other long term (current) drug therapy: Secondary | ICD-10-CM | POA: Diagnosis not present

## 2020-04-30 DIAGNOSIS — E785 Hyperlipidemia, unspecified: Secondary | ICD-10-CM | POA: Insufficient documentation

## 2020-04-30 DIAGNOSIS — K6289 Other specified diseases of anus and rectum: Secondary | ICD-10-CM | POA: Diagnosis not present

## 2020-04-30 HISTORY — DX: Other specified postprocedural states: Z98.890

## 2020-04-30 HISTORY — DX: Other specified postprocedural states: R11.2

## 2020-04-30 HISTORY — PX: POLYPECTOMY: SHX5525

## 2020-04-30 HISTORY — PX: COLONOSCOPY WITH PROPOFOL: SHX5780

## 2020-04-30 SURGERY — COLONOSCOPY WITH PROPOFOL
Anesthesia: General | Site: Rectum

## 2020-04-30 MED ORDER — LACTATED RINGERS IV SOLN: INTRAVENOUS | Status: DC

## 2020-04-30 MED ORDER — PROPOFOL 10 MG/ML IV BOLUS
INTRAVENOUS | Status: DC | PRN
  Administered 2020-04-30: 20 mg via INTRAVENOUS
  Administered 2020-04-30: 40 mg via INTRAVENOUS
  Administered 2020-04-30: 20 mg via INTRAVENOUS
  Administered 2020-04-30: 30 mg via INTRAVENOUS
  Administered 2020-04-30 (×2): 20 mg via INTRAVENOUS
  Administered 2020-04-30: 10 mg via INTRAVENOUS
  Administered 2020-04-30: 20 mg via INTRAVENOUS
  Administered 2020-04-30: 30 mg via INTRAVENOUS
  Administered 2020-04-30: 20 mg via INTRAVENOUS
  Administered 2020-04-30: 70 mg via INTRAVENOUS
  Administered 2020-04-30 (×2): 30 mg via INTRAVENOUS
  Administered 2020-04-30 (×4): 20 mg via INTRAVENOUS
  Administered 2020-04-30: 30 mg via INTRAVENOUS
  Administered 2020-04-30 (×5): 20 mg via INTRAVENOUS
  Administered 2020-04-30: 30 mg via INTRAVENOUS
  Administered 2020-04-30 (×2): 20 mg via INTRAVENOUS
  Administered 2020-04-30: 10 mg via INTRAVENOUS
  Administered 2020-04-30: 20 mg via INTRAVENOUS
  Administered 2020-04-30: 30 mg via INTRAVENOUS

## 2020-04-30 MED ORDER — LIDOCAINE HCL (CARDIAC) PF 100 MG/5ML IV SOSY
PREFILLED_SYRINGE | INTRAVENOUS | Status: DC | PRN
  Administered 2020-04-30: 50 mg via INTRAVENOUS

## 2020-04-30 SURGICAL SUPPLY — 28 items
CLIP HMST 235XBRD CATH ROT (MISCELLANEOUS) IMPLANT
CLIP RESOLUTION 360 11X235 (MISCELLANEOUS)
ELECT REM PT RETURN 9FT ADLT (ELECTROSURGICAL)
ELECTRODE REM PT RTRN 9FT ADLT (ELECTROSURGICAL) IMPLANT
FCP ESCP3.2XJMB 240X2.8X (MISCELLANEOUS) ×1
FORCEPS BIOP RAD 4 LRG CAP 4 (CUTTING FORCEPS) IMPLANT
FORCEPS BIOP RJ4 240 W/NDL (MISCELLANEOUS) ×2
FORCEPS ESCP3.2XJMB 240X2.8X (MISCELLANEOUS) ×1 IMPLANT
GAUZE SPONGE 4X4 12PLY STRL (GAUZE/BANDAGES/DRESSINGS) ×2 IMPLANT
GOWN CVR UNV OPN BCK APRN NK (MISCELLANEOUS) ×2 IMPLANT
GOWN ISOL THUMB LOOP REG UNIV (MISCELLANEOUS) ×4
INJECTOR VARIJECT VIN23 (MISCELLANEOUS) IMPLANT
KIT DEFENDO VALVE AND CONN (KITS) IMPLANT
KIT PRC NS LF DISP ENDO (KITS) ×1 IMPLANT
KIT PROCEDURE OLYMPUS (KITS) ×2
MANIFOLD NEPTUNE II (INSTRUMENTS) ×2 IMPLANT
MARKER SPOT ENDO TATTOO 5ML (MISCELLANEOUS) IMPLANT
NEEDLE CARR LOCKE SCLERO (NEEDLE) ×2 IMPLANT
PROBE APC STR FIRE (PROBE) IMPLANT
RETRIEVER NET ROTH 2.5X230 LF (MISCELLANEOUS) IMPLANT
SNARE LASSO HEX 3 IN 1 (INSTRUMENTS) ×2 IMPLANT
SNARE SHORT THROW 13M SML OVAL (MISCELLANEOUS) IMPLANT
SNARE SHORT THROW 30M LRG OVAL (MISCELLANEOUS) IMPLANT
SNARE SNG USE RND 15MM (INSTRUMENTS) IMPLANT
SPOT EX ENDOSCOPIC TATTOO (MISCELLANEOUS)
TRAP ETRAP POLY (MISCELLANEOUS) ×2 IMPLANT
VARIJECT INJECTOR VIN23 (MISCELLANEOUS)
WATER STERILE IRR 250ML POUR (IV SOLUTION) ×2 IMPLANT

## 2020-04-30 NOTE — H&P (Signed)
Vonda Antigua, MD [address] [place], [place], [place], [address] [address], [place], [place], [place], [address] Phone: [phone]  Fax: [phone]  Primary Care Physician:  Verl Bangs, FNP   Pre-Procedure History & Physical: HPI:  Cassandra Cooley is a 70 y.o. female is here for a colonoscopy.   Past Medical History:  Diagnosis Date  . Arthritis    hands  . Hyperlipidemia   . Hypertension   . PONV (postoperative nausea and vomiting)    also, slow to wake    Past Surgical History:  Procedure Laterality Date  . TUBAL LIGATION    . WRIST SURGERY Right 1990    Prior to Admission medications   Medication Sig Start Date End Date Taking? Authorizing Provider  lidocaine (LIDODERM) 5 % Place 1 patch onto the skin daily. Remove & Discard patch within 12 hours or as directed by MD 06/20/19  Yes Malfi, Lupita Raider, FNP  lisinopril (ZESTRIL) 5 MG tablet Take 1 tablet (5 mg total) by mouth daily. 01/23/20  Yes Malfi, Lupita Raider, FNP  rosuvastatin (CRESTOR) 5 MG tablet TK 1 T PO QD FOR HIGH CHOLESTEROL 01/23/20  Yes Malfi, Lupita Raider, FNP  aspirin 81 MG EC tablet Take 81 mg by mouth daily. Swallow whole. Patient not taking: Reported on 04/24/2020    [provider]  Cholecalciferol (VITAMIN D3 PO) Take 2,000 Units by mouth 2 (two) times a week. Patient not taking: Reported on 04/24/2020    [provider]  MAGNESIUM PO Take 150 mg by mouth daily. Patient not taking: No sig reported    [provider]    Allergies as of 04/22/2020 - Review Complete 04/22/2020  Allergen Reaction Noted  . Shellfish allergy Swelling 11/08/2018    Family History  Problem Relation Age of Onset  . Breast cancer Mother 63  . Hyperlipidemia Father   . Heart disease Father 76  . Kidney failure Sister   . Hypertension Sister   . Diabetes Mellitus II Maternal Grandfather   . Heart disease Paternal Grandmother   . Heart disease Paternal Grandfather   . Healthy  Brother   . Healthy Daughter     Social History   Socioeconomic History  . Marital status: Widowed    Spouse name: Not on file  . Number of children: Not on file  . Years of education: Not on file  . Highest education level: Not on file  Occupational History  . Occupation: retired   Tobacco Use  . Smoking status: Never Smoker  . Smokeless tobacco: Never Used  Vaping Use  . Vaping Use: Never used  Substance and Sexual Activity  . Alcohol use: Yes    Alcohol/week: 3.0 standard drinks    Types: 3 Glasses of wine per week    Comment: glass white wine every evening   . Drug use: Never  . Sexual activity: Not Currently  Other Topics Concern  . Not on file  Social History Narrative  . Not on file   Social Determinants of Health   Financial Resource Strain: Not on file  Food Insecurity: Not on file  Transportation Needs: Not on file  Physical Activity: Not on file  Stress: Not on file  Social Connections: Not on file  Intimate Partner Violence: Not on file    Review of Systems: See HPI, otherwise negative ROS  Physical Exam: BP 130/76   Pulse 93   Temp 97.6 F (36.4 C) (Temporal)   Ht 5' 5.5" (  1.664 m)   Wt 62.6 kg   SpO2 100%   BMI 22.62 kg/m  General:   Alert,  pleasant and cooperative in NAD Head:  Normocephalic and atraumatic. Neck:  Supple; no masses or thyromegaly. Lungs:  Clear throughout to auscultation, normal respiratory effort.    Heart:  +S1, +S2, Regular rate and rhythm, No edema. Abdomen:  Soft, nontender and nondistended. Normal bowel sounds, without guarding, and without rebound.   Neurologic:  Alert and  oriented x4;  grossly normal neurologically.  Impression/Plan: Cassandra Cooley is here for a colonoscopy to be performed for positive cologuard  Risks, benefits, limitations, and alternatives regarding  colonoscopy have been reviewed with the patient.  Questions have been answered.  All parties agreeable.   Virgel Manifold, MD   04/30/2020, 10:28 AM

## 2020-04-30 NOTE — Anesthesia Procedure Notes (Signed)
Performed by: Amyot, Michael, CRNA Pre-anesthesia Checklist: Patient identified, Emergency Drugs available, Suction available, Timeout performed and Patient being monitored Patient Re-evaluated:Patient Re-evaluated prior to induction Oxygen Delivery Method: Nasal cannula Placement Confirmation: positive ETCO2       

## 2020-04-30 NOTE — Anesthesia Preprocedure Evaluation (Signed)
Anesthesia Evaluation  Patient identified by MRN, date of birth, ID band Patient awake    Reviewed: Allergy & Precautions, NPO status , Patient's Chart, lab work & pertinent test results  History of Anesthesia Complications (+) PONV and history of anesthetic complications  Airway Mallampati: II  TM Distance: >3 FB Neck ROM: Full    Dental no notable dental hx.    Pulmonary neg pulmonary ROS,    Pulmonary exam normal        Cardiovascular hypertension, Normal cardiovascular exam     Neuro/Psych negative neurological ROS  negative psych ROS   GI/Hepatic negative GI ROS, Neg liver ROS,   Endo/Other    Renal/GU negative Renal ROS     Musculoskeletal  (+) Arthritis ,   Abdominal Normal abdominal exam  (+)   Peds  Hematology negative hematology ROS (+)   Anesthesia Other Findings   Reproductive/Obstetrics                             Anesthesia Physical Anesthesia Plan  ASA: II  Anesthesia Plan: General   Post-op Pain Management:    Induction: Intravenous  PONV Risk Score and Plan: 4 or greater and TIVA, Propofol infusion and Treatment may vary due to age or medical condition  Airway Management Planned: Natural Airway  Additional Equipment: None  Intra-op Plan:   Post-operative Plan:   Informed Consent: I have reviewed the patients History and Physical, chart, labs and discussed the procedure including the risks, benefits and alternatives for the proposed anesthesia with the patient or authorized representative who has indicated his/her understanding and acceptance.     Dental advisory given  Plan Discussed with: CRNA  Anesthesia Plan Comments:         Anesthesia Quick Evaluation

## 2020-04-30 NOTE — Op Note (Signed)
[facility] Patient Name: Cassandra Cooley Procedure Date: 04/30/2020 11:24 AM MRN: [id number] Account #: [id number] Date of Birth: [birth year] Admit Type: Outpatient Age: 70 Room: [facility] OR ROOM 01 Gender: Female Note Status: Finalized Procedure:             Colonoscopy Indications:           Positive Cologuard test Providers:             Shemiah Rosch B. Bonna Gains MD, MD Referring MD:          Lupita Raider. Malfi (Referring MD) Medicines:             Monitored Anesthesia Care Complications:         No immediate complications. Procedure:             Pre-Anesthesia Assessment:                        - ASA Grade Assessment: II - A patient with mild                         systemic disease.                        - Prior to the procedure, a History and Physical was                         performed, and patient medications, allergies and                         sensitivities were reviewed. The patient's tolerance                         of previous anesthesia was reviewed.                        - The risks and benefits of the procedure and the                         sedation options and risks were discussed with the                         patient. All questions were answered and informed                         consent was obtained.                        - Patient identification and proposed procedure were                         verified prior to the procedure by the physician, the                         nurse, the anesthesiologist, the anesthetist and the                         technician. The procedure was verified in the                         procedure room.  After obtaining informed consent, the colonoscope was                         passed under direct vision. Throughout the procedure,                         the patient's blood pressure, pulse, and oxygen                         saturations were monitored continuously. The                          Colonoscope was introduced through the anus and                         advanced to the the cecum, identified by appendiceal                         orifice and ileocecal valve. The colonoscopy was                         performed with ease. The patient tolerated the                         procedure well. The quality of the bowel preparation                         was good. Findings:      The perianal and digital rectal examinations were normal.      A 12 mm polyp was found in the cecum. The polyp was flat. The polyp was       removed with a hot snare. Resection and retrieval were complete.      A 4 mm polyp was found in the sigmoid colon. The polyp was sessile. The       polyp was removed with a jumbo cold forceps. Resection and retrieval       were complete.      A patchy area of mildly nodular mucosa was found in the rectum. Biopsies       were taken with a cold forceps for histology.      The colon (entire examined portion) was significantly tortuous.       Advancing the scope required using manual pressure and withdrawing and       reinserting the scope.      A few diverticula were found in the entire colon.      Multiple small and large-mouthed diverticula were found in the sigmoid       colon.      The exam was otherwise without abnormality.      The rectum, sigmoid colon, descending colon, transverse colon, ascending       colon and cecum appeared normal.      Retroflexion in the rectum was not performed due to Narrow rectum.       Careful frontal view of the rectum was otherwise normal. Impression:            - One 12 mm polyp in the cecum, removed with a hot                         snare.  Resected and retrieved.                        - One 15 mm polyp in the ascending colon, removed with                         mucosal resection. Resected and retrieved.                        - One 4 mm polyp in the sigmoid colon, removed with a                          jumbo cold forceps. Resected and retrieved.                        - Nodular mucosa in the rectum. Biopsied.                        - Tortuous colon.                        - Diverticulosis in the entire examined colon.                        - Diverticulosis in the sigmoid colon.                        - The examination was otherwise normal.                        - The rectum, sigmoid colon, descending colon,                         transverse colon, ascending colon and cecum are normal.                        - Mucosal resection was performed. Resection and                         retrieval were complete. Recommendation:        - Discharge patient to home (with escort).                        - Advance diet as tolerated.                        - Continue present medications.                        - Await pathology results.                        - Repeat colonoscopy date to be determined after                         pending pathology results are reviewed.                        - The findings and recommendations were discussed with  the patient.                        - The findings and recommendations were discussed with                         the patient's family.                        - Return to primary care physician as previously                         scheduled.                        - High fiber diet. Procedure Code(s):     --- Professional ---                        [phone], Colonoscopy, flexible; with endoscopic mucosal                         resection                        45385, 69, Colonoscopy, flexible; with removal of                         tumor(s), polyp(s), or other lesion(s) by snare                         technique                        45380, 9, Colonoscopy, flexible; with biopsy, single                         or multiple Diagnosis Code(s):     --- Professional ---                        K63.5, Polyp of colon                         K62.89, Other specified diseases of anus and rectum                        R19.5, Other fecal abnormalities CPT copyright 2019 American Medical Association. All rights reserved. The codes documented in this report are preliminary and upon coder review may  be revised to meet current compliance requirements.  Vonda Antigua, MD Margretta Sidle B. Bonna Gains MD, MD 04/30/2020 12:49:31 PM This report has been signed electronically. Number of Addenda: 0 Note Initiated On: 04/30/2020 11:24 AM Scope Withdrawal Time: 0 hours 45 minutes 3 seconds  Total Procedure Duration: 1 hour 1 minute 7 seconds  Estimated Blood Loss:  Estimated blood loss: none.      [facility]

## 2020-04-30 NOTE — Anesthesia Postprocedure Evaluation (Signed)
Anesthesia Post Note  Patient: [facility]  Procedure(s) Performed: COLONOSCOPY WITH PROPOFOL (N/A Rectum) POLYPECTOMY (N/A Rectum)     Patient location during evaluation: PACU Anesthesia Type: General Level of consciousness: awake and alert Pain management: pain level controlled Vital Signs Assessment: post-procedure vital signs reviewed and stable Respiratory status: spontaneous breathing and nonlabored ventilation Cardiovascular status: blood pressure returned to baseline Postop Assessment: no apparent nausea or vomiting Anesthetic complications: no   No complications documented.  Alizay Bronkema [employer]

## 2020-04-30 NOTE — Transfer of Care (Signed)
Immediate Anesthesia Transfer of Care Note  Patient: Cassandra Cooley [facility]  Procedure(s) Performed: COLONOSCOPY WITH PROPOFOL (N/A Rectum) POLYPECTOMY (N/A Rectum)  Patient Location: PACU  Anesthesia Type: General  Level of Consciousness: awake, alert  and patient cooperative  Airway and Oxygen Therapy: Patient Spontanous Breathing and Patient connected to supplemental oxygen  Post-op Assessment: Post-op Vital signs reviewed, Patient's Cardiovascular Status Stable, Respiratory Function Stable, Patent Airway and No signs of Nausea or vomiting  Post-op Vital Signs: Reviewed and stable  Complications: No complications documented.

## 2020-05-01 ENCOUNTER — Encounter: Payer: Self-pay | Admitting: Gastroenterology

## 2020-05-03 LAB — SURGICAL PATHOLOGY

## 2020-05-09 ENCOUNTER — Telehealth: Payer: Self-pay | Admitting: Gastroenterology

## 2020-05-09 ENCOUNTER — Telehealth: Payer: Self-pay

## 2020-05-09 NOTE — Telephone Encounter (Signed)
Called patient to let her know that her pathology report stated that everything was negative for dysplasia and malignancy. Patient understood and had no further questions. Patient did state that she was going to follow a high fiber diet and Metamucil as recommended by Dr. Bonna Gains.

## 2020-05-09 NOTE — Telephone Encounter (Signed)
Copied from [place] [id number]. Topic: Quick Communication - Other Results (Clinic Use ONLY) >> May 08, 2020 12:04 PM Scherrie Gerlach wrote: Pt would like someone to call her back and go over the results of her colonoscopy.  She said they biopsied a polyp and she has not heard anything from anyone.

## 2020-05-09 NOTE — Telephone Encounter (Signed)
Patient calling about colonoscopy pathology results.

## 2020-05-09 NOTE — Telephone Encounter (Signed)
Please read other note.

## 2020-05-09 NOTE — Telephone Encounter (Signed)
I contacted the patient and informed her that she need to contact her Gastroenterologist to request her colonoscopy results. She informed me that she will be transferring her care over to [facility]. The pt requested that we send over her medical records. I informed the patient that she need to come by the office and sign a waiver to give [place] permissions to release her medical records to [facility]. She verbalize understanding.

## 2020-05-09 NOTE — Telephone Encounter (Signed)
Patient calling for pathology results.

## 2020-05-16 ENCOUNTER — Encounter: Payer: Self-pay | Admitting: Gastroenterology

## 2020-06-18 DIAGNOSIS — K635 Polyp of colon: Secondary | ICD-10-CM

## 2020-06-18 DIAGNOSIS — M79602 Pain in left arm: Secondary | ICD-10-CM | POA: Insufficient documentation

## 2020-08-30 ENCOUNTER — Emergency Department
Admission: EM | Admit: 2020-08-30 | Discharge: 2020-08-31 | Disposition: A | Discharge status: Home / Self Care | Payer: Medicare Other | Attending: Emergency Medicine | Admitting: Emergency Medicine

## 2020-08-30 ENCOUNTER — Other Ambulatory Visit: Payer: Self-pay

## 2020-08-30 DIAGNOSIS — R112 Nausea with vomiting, unspecified: Secondary | ICD-10-CM | POA: Diagnosis not present

## 2020-08-30 DIAGNOSIS — I10 Essential (primary) hypertension: Secondary | ICD-10-CM | POA: Diagnosis not present

## 2020-08-30 DIAGNOSIS — Z7982 Long term (current) use of aspirin: Secondary | ICD-10-CM | POA: Diagnosis not present

## 2020-08-30 DIAGNOSIS — E869 Volume depletion, unspecified: Secondary | ICD-10-CM | POA: Diagnosis not present

## 2020-08-30 DIAGNOSIS — Z79899 Other long term (current) drug therapy: Secondary | ICD-10-CM | POA: Insufficient documentation

## 2020-08-30 DIAGNOSIS — R197 Diarrhea, unspecified: Secondary | ICD-10-CM | POA: Insufficient documentation

## 2020-08-30 LAB — COMPREHENSIVE METABOLIC PANEL
ALT: 31 U/L (ref 0–44)
AST: 35 U/L (ref 15–41)
Albumin: 4.5 g/dL (ref 3.5–5.0)
Alkaline Phosphatase: 110 U/L (ref 38–126)
Anion gap: 10 (ref 5–15)
BUN: 26 mg/dL — ABNORMAL HIGH (ref 8–23)
CO2: 20 mmol/L — ABNORMAL LOW (ref 22–32)
Calcium: 9.7 mg/dL (ref 8.9–10.3)
Chloride: 107 mmol/L (ref 98–111)
Creatinine, Ser: 1.11 mg/dL — ABNORMAL HIGH (ref 0.44–1.00)
GFR, Estimated: 53 mL/min — ABNORMAL LOW (ref >60)
Glucose, Bld: 113 mg/dL — ABNORMAL HIGH (ref 70–99)
Potassium: 4.2 mmol/L (ref 3.5–5.1)
Sodium: 137 mmol/L (ref 135–145)
Total Bilirubin: 1 mg/dL (ref 0.3–1.2)
Total Protein: 8.6 g/dL — ABNORMAL HIGH (ref 6.5–8.1)

## 2020-08-30 LAB — CBC
HCT: 35.8 % — ABNORMAL LOW (ref 36.0–46.0)
Hemoglobin: 11.8 g/dL — ABNORMAL LOW (ref 12.0–15.0)
MCH: 31.3 pg (ref 26.0–34.0)
MCHC: 33 g/dL (ref 30.0–36.0)
MCV: 95 fL (ref 80.0–100.0)
Platelets: 297 10*3/uL (ref 150–400)
RBC: 3.77 MIL/uL — ABNORMAL LOW (ref 3.87–5.11)
RDW: 13.4 % (ref 11.5–15.5)
WBC: 7.9 10*3/uL (ref 4.0–10.5)
nRBC: 0 % (ref 0.0–0.2)

## 2020-08-30 LAB — LIPASE, BLOOD: Lipase: 30 U/L (ref 11–51)

## 2020-08-30 NOTE — ED Triage Notes (Signed)
Pt states she started to have vomiting and diarrhea since this morning. Pt denies abd pain. States she has not been able to keep anything down'

## 2020-08-31 DIAGNOSIS — R112 Nausea with vomiting, unspecified: Secondary | ICD-10-CM | POA: Diagnosis not present

## 2020-08-31 MED ORDER — LACTATED RINGERS IV BOLUS
1000.0000 mL | Freq: Once | INTRAVENOUS | Status: AC
  Administered 2020-08-31: 1000 mL via INTRAVENOUS

## 2020-08-31 MED ORDER — ONDANSETRON 4 MG PO TBDP: ORAL_TABLET | ORAL | 0 refills | Status: AC

## 2020-08-31 MED ORDER — ONDANSETRON HCL 4 MG/2ML IJ SOLN
4.0000 mg | INTRAMUSCULAR | Status: AC
  Administered 2020-08-31: 4 mg via INTRAVENOUS
  Filled 2020-08-31: qty 2

## 2020-08-31 NOTE — Discharge Instructions (Addendum)

## 2020-08-31 NOTE — ED Notes (Signed)
ED Provider at bedside. 

## 2020-08-31 NOTE — ED Notes (Signed)
Pt states she is not ready to attempt a PO challenge yet

## 2020-08-31 NOTE — ED Provider Notes (Signed)
[facility] Emergency Department Provider Note  ____________________________________________   Event Date/Time   First MD Initiated Contact with Patient 08/30/20 2358     (approximate)  I have reviewed the triage vital signs and the nursing notes.   HISTORY  Chief Complaint Emesis and Diarrhea    HPI Cassandra Cooley is a 70 y.o. female who is generally quite healthy for her age.  She presents tonight for evaluation of acute onset and severe nausea, vomiting, and diarrhea.  She said she had a normal night last night and had spaghetti with her grandson.  He did not get sick but when she woke up this morning almost immediately she had an episode of diarrhea and then has had multiple episodes of watery diarrhea with numerous episodes of vomiting.  She says she cannot keep anything down including water.  She is try to drink some water to stay hydrated but it just goes right through her.  Nothing in particular makes it better or worse.  She has had a similar episode in the past where she needed some IV fluids and IV medicine to make it stop.  She denies fever, sore throat, chest pain, shortness of breath.  She is having no abdominal pain other than some aching when she has the diarrhea or vomits.  She has had no respiratory issues recently and no pain in her ears and no extensive nasal congestion.  She is vaccinated for COVID-19 but has not yet gotten her booster.     Past Medical History:  Diagnosis Date   Arthritis    hands   Hyperlipidemia    Hypertension    PONV (postoperative nausea and vomiting)    also, slow to wake    Patient Active Problem List   Diagnosis Date Noted   Positive colorectal cancer screening using Cologuard test    Polyp of sigmoid colon    Rectal nodule    Cecal polyp    Polyp of ascending colon    Hypertension 01/23/2020   Encounter for screening mammogram for malignant neoplasm of breast 01/23/2020   Postmenopausal 01/23/2020    Colon cancer screening 01/23/2020   Hyperlipidemia 06/20/2019    Past Surgical History:  Procedure Laterality Date   COLONOSCOPY WITH PROPOFOL N/A 04/30/2020   Procedure: COLONOSCOPY WITH PROPOFOL;  Surgeon: Virgel Manifold, MD;  Location: [place];  Service: Endoscopy;  Laterality: N/A;  priority 4   POLYPECTOMY N/A 04/30/2020   Procedure: POLYPECTOMY;  Surgeon: Virgel Manifold, MD;  Location: [place];  Service: Endoscopy;  Laterality: N/A;   TUBAL LIGATION     WRIST SURGERY Right 1990    Prior to Admission medications   Medication Sig Start Date End Date Taking? Authorizing Provider  ondansetron (ZOFRAN ODT) 4 MG disintegrating tablet Allow 1-2 tablets to dissolve in your mouth every 8 hours as needed for nausea/vomiting 08/31/20  Yes Hinda Kehr, MD  aspirin 81 MG EC tablet Take 81 mg by mouth daily. Swallow whole. Patient not taking: Reported on 04/24/2020    [provider]  Cholecalciferol (VITAMIN D3 PO) Take 2,000 Units by mouth 2 (two) times a week. Patient not taking: Reported on 04/24/2020    [provider]  lidocaine (LIDODERM) 5 % Place 1 patch onto the skin daily. Remove & Discard patch within 12 hours or as directed by MD 06/20/19   Malfi, Lupita Raider, FNP  lisinopril (ZESTRIL) 5 MG tablet Take 1 tablet (5 mg total) by mouth  daily. 01/23/20   Verl Bangs, FNP  MAGNESIUM PO Take 150 mg by mouth daily. Patient not taking: No sig reported    [provider]  rosuvastatin (CRESTOR) 5 MG tablet TK 1 T PO QD FOR HIGH CHOLESTEROL 01/23/20   Malfi, Lupita Raider, FNP    Allergies Shellfish allergy  Family History  Problem Relation Age of Onset   Breast cancer Mother 73   Hyperlipidemia Father    Heart disease Father 53   Kidney failure Sister    Hypertension Sister    Diabetes Mellitus II Maternal Grandfather    Heart disease Paternal Grandmother    Heart disease Paternal Grandfather    Healthy Brother    Healthy  Daughter     Social History Social History   Tobacco Use   Smoking status: Never   Smokeless tobacco: Never  Vaping Use   Vaping Use: Never used  Substance Use Topics   Alcohol use: Yes    Alcohol/week: 3.0 standard drinks    Types: 3 Glasses of wine per week    Comment: glass white wine every evening    Drug use: Never    Review of Systems Constitutional: No fever/chills Eyes: No visual changes. ENT: No sore throat. Cardiovascular: Denies chest pain. Respiratory: Denies shortness of breath. Gastrointestinal: No abdominal pain.  Extensive nausea, vomiting, and diarrhea. Genitourinary: Negative for dysuria. Musculoskeletal: Negative for neck pain.  Negative for back pain. Integumentary: Negative for rash. Neurological: Negative for headaches, focal weakness or numbness.   ____________________________________________   PHYSICAL EXAM:  VITAL SIGNS: ED Triage Vitals  Enc Vitals Group     BP 08/30/20 2228 (!) 139/107     Pulse Rate 08/30/20 2228 89     Resp 08/30/20 2228 18     Temp 08/30/20 2228 99.4 F (37.4 C)     Temp Source 08/30/20 2228 Oral     SpO2 08/30/20 2228 100 %     Weight 08/30/20 2227 67.6 kg (149 lb)     Height 08/30/20 2227 1.651 m (5\' 5" )     Head Circumference --      Peak Flow --      Pain Score 08/30/20 2227 0     Pain Loc --      Pain Edu? --      Excl. in [place]? --     Constitutional: Alert and oriented.  Generally well-appearing in spite of her symptoms. Eyes: Conjunctivae are normal.  Head: Atraumatic. Nose: No congestion/rhinnorhea. Mouth/Throat: Patient is wearing a mask. Neck: No stridor.  No meningeal signs.   Cardiovascular: Normal rate, regular rhythm. Good peripheral circulation. Respiratory: Normal respiratory effort.  No retractions. Gastrointestinal: Soft and nontender.  Nondistended. Musculoskeletal: No lower extremity tenderness nor edema. No gross deformities of extremities. Neurologic:  Normal speech and language. No  gross focal neurologic deficits are appreciated.  Skin:  Skin is warm, dry and intact. Psychiatric: Mood and affect are normal. Speech and behavior are normal.  ____________________________________________   LABS (all labs ordered are listed, but only abnormal results are displayed)  Labs Reviewed  COMPREHENSIVE METABOLIC PANEL - Abnormal; Notable for the following components:      Result Value   CO2 20 (*)    Glucose, Bld 113 (*)    BUN 26 (*)    Creatinine, Ser 1.11 (*)    Total Protein 8.6 (*)    GFR, Estimated 53 (*)    All other components within normal limits  CBC - Abnormal; Notable  for the following components:   RBC 3.77 (*)    Hemoglobin 11.8 (*)    HCT 35.8 (*)    All other components within normal limits  LIPASE, BLOOD   ____________________________________________   INITIAL IMPRESSION / MDM / ASSESSMENT AND PLAN / ED COURSE  As part of my medical decision making, I reviewed the following data within the [place] notes reviewed and incorporated, Labs reviewed , Old chart reviewed, and Notes from prior ED visits   Differential diagnosis includes, but is not limited to, viral gastroenteritis, foodborne pathogen, bacterial gastroenteritis, SBO/ileus.  SBO/ileus possible but the patient has no abdominal pain and no distention.  The onset of her symptoms this morning, started with diarrhea and proceeding with vomiting, strongly suggests a viral illness.  Her vital signs are reassuring other than a very slightly elevated temperature of 99.4 but she has no other symptoms, and again, she has no tenderness to palpation of her abdomen.  She is not tachycardic and appears well-hydrated and spite of the symptoms she has been having today.  She has not been having any dysuria nor increased urinary frequency.  Her CBC is normal and her lipase is normal.  Her comprehensive metabolic panel shows a slight elevation of her BUN and her creatinine which is  likely volume related.  We agreed on the plan for 1 L lactated Ringer's and 4 mg of Zofran and then I will reassess how she is doing.  Hopefully she will be able to tolerate a p.o. challenge and will feel comfortable going home with a prescription for Zofran.       Clinical Course as of 08/31/20 0349  Sat Aug 31, 2020  0225 Patient feeling quite a bit better, 1 L of LR is done, she is now trying a p.o. challenge and says that she is looking forward to going home if she keeps it down. [CF]  0348 Patient is feeling well and has not had any additional vomiting or diarrhea.  She feels like she can go home.  She tolerated her p.o. challenge without difficulty.  I provided Zofran prescription and gave my usual and customary return precautions. [CF]    Clinical Course User Index [CF] Hinda Kehr, MD     ____________________________________________  FINAL CLINICAL IMPRESSION(S) / ED DIAGNOSES  Final diagnoses:  Nausea vomiting and diarrhea  Volume depletion     MEDICATIONS GIVEN DURING THIS VISIT:  Medications  ondansetron (ZOFRAN) injection 4 mg (4 mg Intravenous Given 08/31/20 0059)  lactated ringers bolus 1,000 mL (1,000 mLs Intravenous New Bag/Given 08/31/20 0059)     ED Discharge Orders          Ordered    ondansetron (ZOFRAN ODT) 4 MG disintegrating tablet        08/31/20 0348             Note:  This document was prepared using Dragon voice recognition software and may include unintentional dictation errors.   Hinda Kehr, MD 08/31/20 [phone]

## 2020-08-31 NOTE — ED Notes (Signed)
Pt tolerated PO.

## 2021-03-20 ENCOUNTER — Other Ambulatory Visit: Payer: Self-pay | Admitting: Internal Medicine

## 2021-03-20 DIAGNOSIS — Z1231 Encounter for screening mammogram for malignant neoplasm of breast: Secondary | ICD-10-CM

## 2021-05-22 ENCOUNTER — Ambulatory Visit
Admission: RE | Admit: 2021-05-22 | Discharge: 2021-05-22 | Disposition: A | Discharge status: Home / Self Care | Payer: Medicare Other | Source: Ambulatory Visit | Attending: Internal Medicine | Admitting: Internal Medicine

## 2021-05-22 DIAGNOSIS — Z1231 Encounter for screening mammogram for malignant neoplasm of breast: Secondary | ICD-10-CM | POA: Diagnosis present

## 2021-09-18 DIAGNOSIS — R0981 Nasal congestion: Secondary | ICD-10-CM | POA: Insufficient documentation

## 2021-09-22 ENCOUNTER — Other Ambulatory Visit: Payer: Self-pay | Admitting: Internal Medicine

## 2021-09-22 DIAGNOSIS — N1831 Chronic kidney disease, stage 3a: Secondary | ICD-10-CM

## 2021-10-01 ENCOUNTER — Ambulatory Visit
Admission: RE | Admit: 2021-10-01 | Discharge: 2021-10-01 | Disposition: A | Discharge status: Home / Self Care | Payer: Medicare Other | Source: Ambulatory Visit | Attending: Internal Medicine | Admitting: Internal Medicine

## 2021-10-01 DIAGNOSIS — N1831 Chronic kidney disease, stage 3a: Secondary | ICD-10-CM | POA: Insufficient documentation

## 2021-11-25 DIAGNOSIS — R8290 Unspecified abnormal findings in urine: Secondary | ICD-10-CM | POA: Insufficient documentation

## 2021-11-25 DIAGNOSIS — E875 Hyperkalemia: Secondary | ICD-10-CM | POA: Insufficient documentation

## 2022-04-30 ENCOUNTER — Telehealth: Payer: Self-pay

## 2022-04-30 NOTE — Telephone Encounter (Signed)
Contacted patient to schedule her colonoscopy. Based on chart review and 05/16/20 Results letter patient is not due for her colonoscopy until 3 years.  This will be noted in her referral and will close referral at this time.  Thanks, [place], [place]

## 2022-05-08 ENCOUNTER — Other Ambulatory Visit: Payer: Self-pay | Admitting: Internal Medicine

## 2022-05-08 DIAGNOSIS — Z1231 Encounter for screening mammogram for malignant neoplasm of breast: Secondary | ICD-10-CM

## 2022-08-26 ENCOUNTER — Ambulatory Visit
Admission: RE | Admit: 2022-08-26 | Discharge: 2022-08-26 | Disposition: A | Discharge status: Home / Self Care | Payer: Medicare Other | Source: Ambulatory Visit | Attending: Internal Medicine | Admitting: Internal Medicine

## 2022-08-26 DIAGNOSIS — Z1231 Encounter for screening mammogram for malignant neoplasm of breast: Secondary | ICD-10-CM | POA: Insufficient documentation

## 2023-01-21 DIAGNOSIS — H348192 Central retinal vein occlusion, unspecified eye, stable: Secondary | ICD-10-CM | POA: Insufficient documentation

## 2023-04-06 ENCOUNTER — Ambulatory Visit
Admission: RE | Admit: 2023-04-06 | Discharge: 2023-04-06 | Disposition: A | Discharge status: Home / Self Care | Payer: Medicare Other | Source: Ambulatory Visit | Attending: Internal Medicine | Admitting: Internal Medicine

## 2023-04-06 ENCOUNTER — Other Ambulatory Visit: Payer: Self-pay | Admitting: Internal Medicine

## 2023-04-06 DIAGNOSIS — M7989 Other specified soft tissue disorders: Secondary | ICD-10-CM | POA: Insufficient documentation

## 2023-04-07 ENCOUNTER — Telehealth: Payer: Self-pay

## 2023-04-07 NOTE — Telephone Encounter (Signed)
Colonoscopy referral received from patients PCP.  Pt stated she is not quite ready to schedule her colonoscopy.  Informed her that I will mail her a reminder letter and she can call to schedule when she is ready.  Thanks, [place], [place]

## 2023-08-19 ENCOUNTER — Encounter: Attending: Internal Medicine | Admitting: Dietician

## 2023-08-19 ENCOUNTER — Encounter: Payer: Self-pay | Admitting: Dietician

## 2023-08-19 VITALS — Ht 65.0 in | Wt 130.6 lb

## 2023-08-19 DIAGNOSIS — E782 Mixed hyperlipidemia: Secondary | ICD-10-CM | POA: Insufficient documentation

## 2023-08-19 DIAGNOSIS — Z6821 Body mass index (BMI) 21.0-21.9, adult: Secondary | ICD-10-CM | POA: Insufficient documentation

## 2023-08-19 DIAGNOSIS — R7303 Prediabetes: Secondary | ICD-10-CM | POA: Insufficient documentation

## 2023-08-19 DIAGNOSIS — R634 Abnormal weight loss: Secondary | ICD-10-CM | POA: Insufficient documentation

## 2023-08-19 DIAGNOSIS — E559 Vitamin D deficiency, unspecified: Secondary | ICD-10-CM | POA: Diagnosis not present

## 2023-08-19 DIAGNOSIS — Z713 Dietary counseling and surveillance: Secondary | ICD-10-CM | POA: Diagnosis not present

## 2023-08-19 DIAGNOSIS — N1831 Chronic kidney disease, stage 3a: Secondary | ICD-10-CM | POA: Insufficient documentation

## 2023-08-19 NOTE — Patient Instructions (Addendum)
 Add in a high calorie protein shake, such as Ensure PLUS, Boost Plus or Very High Calorie, divided into 2 serving between meals.  Be sure to eat/drink something about everything 3 hours!  Have a small handful of cashews as a snack a couple of times during the day.  When making a PB and J sandwich, spread your PB more liberally.  Switch to a 2% milk in place of skim.  Use canola oil instead of PAM spray when cooking food in your sauce pans.  Try SmartBalance butter as a healthier alternative to regular butter.

## 2023-08-19 NOTE — Progress Notes (Signed)
 Medical Nutrition Therapy  Appointment Start time:  1030  Appointment End time:  1135  Primary concerns today: Unexpected weight loss  Referral diagnosis: R63.4 Abnormal weight loss Preferred learning style: No preference indicated Learning readiness: Ready   NUTRITION ASSESSMENT   Anthropometrics Ht: 65 Wt: 130.6 lbs BMI: 21.73 kg/m2 UBW: 140-145 lbs Goal Weight: 145 lbs  Clinical Medical Hx: CKD S3a, Prediabetes, HLD, Vit D deficiency Medications: Lisinopril , Rosuvastatin  Labs: RBC - 3.69 (low), Hemoglobin - 11.3 (low), eGFR - 53 (3a), A1c - 5.8% (PreDM), Cholesterol - 204 (high), HDL - 109.9 (HIGH) Notable Signs/Symptoms: Pronounced clavicles, slight interosseous wasting   Lifestyle & Dietary Hx Pt reports unintentional weight loss of ~10 lbs in the last couple of months, unsure as to why, interested in nutrition strategies to regain weight. Pt reports period of elevated stress between January and May related to grandson staying with them, pt states they will read to relax. Pt reports usually eating 3 meals a day, may occasionally miss lunch. Pt reports restricting certain foods related to CKD S3a, trying to eat low potassium diet, and is drinking 64 oz of plain water daily. Pt states they have Googled a lot of information regarding diet and their chronic diseases, unsure as to what information is valid.   Estimated daily fluid intake: >64 oz Supplements: Vitamin D Sleep: Improved with Trazodone Stress / self-care: Moderate Current average weekly physical activity: ADLs, walks a lot   24-Hr Dietary Recall First Meal: Strawberries, bran muffin Snack:  Second Meal: 2 tacos (ground beef, sour cream, lettuce, tomato) Snack:  Third Meal: [employer] pizza cups, collard greens Snack:  Beverages: Water   NUTRITION DIAGNOSIS  [place]-3.2 Unintentional weight loss As related to inadequate calorie intake.  As evidenced by weight loss of 10 lbs in last 3 months, avoidance of  high calorie foods for health concerns.   NUTRITION INTERVENTION  Nutrition education (E-1) on the following topics:  Educated patient on the role of increasing calories and protein on building muscle mass and healthy weight gain. Worked with patient to identify sources of protein and healthy fats, as well as how to increase calories when preparing food. Educated patient to eat 5-6 times a day to maximize protein intake. Encourage patient to incorporate physical activity, especially resistance training, as much as possible.  Handouts Provided Include  High Protein High Calorie Nutrition Therapy  Learning Style & Readiness for Change Teaching method utilized: Visual & Auditory  Demonstrated degree of understanding via: Teach Back  Barriers to learning/adherence to lifestyle change: None  Goals Established by Pt Add in a high calorie protein shake, such as Ensure PLUS, Boost Plus or Very High Calorie, divided into 2 serving between meals. Be sure to eat/drink something about everything 3 hours! Have a small handful of cashews as a snack a couple of times during the day. When making a PB and J sandwich, spread your PB more liberally. Switch to a 2% milk in place of skim. Use canola oil instead of PAM spray when cooking food in your sauce pans. Try SmartBalance butter as a healthier alternative to regular butter.   MONITORING & EVALUATION Dietary intake, weekly physical activity, and weight gain in 4-6 weeks.  Next Steps  Patient is to follow up with RD.

## 2023-08-24 ENCOUNTER — Telehealth: Payer: Self-pay

## 2023-08-24 ENCOUNTER — Other Ambulatory Visit: Payer: Self-pay

## 2023-08-24 ENCOUNTER — Other Ambulatory Visit: Payer: Self-pay | Admitting: Internal Medicine

## 2023-08-24 DIAGNOSIS — Z1231 Encounter for screening mammogram for malignant neoplasm of breast: Secondary | ICD-10-CM

## 2023-08-24 DIAGNOSIS — H34811 Central retinal vein occlusion, right eye, with macular edema: Secondary | ICD-10-CM | POA: Insufficient documentation

## 2023-08-24 DIAGNOSIS — Z860100 Personal history of colon polyps, unspecified: Secondary | ICD-10-CM

## 2023-08-24 MED ORDER — NA SULFATE-K SULFATE-MG SULF 17.5-3.13-1.6 GM/177ML PO SOLN: 1.0000 | Freq: Once | ORAL | 0 refills | Status: AC

## 2023-08-24 NOTE — Telephone Encounter (Signed)
 Gastroenterology Pre-Procedure Review  Request Date: 09/14/23 Requesting Physician: Dr. Ole Berkeley  PATIENT REVIEW QUESTIONS: The patient responded to the following health history questions as indicated:    1. Are you having any GI issues? Weight loss 10 pounds in 4 months seeing nutritionist no GI Issues reported 2. Do you have a personal history of Polyps? yes (last colonoscopy 04/30/20 with Dr. Tully Gainer recommended repeat in 3 years) 3. Do you have a family history of Colon Cancer or Polyps? Mothers cousin had colon cancer 4. Diabetes Mellitus? no 5. Joint replacements in the past 12 months?no 6. Major health problems in the past 3 months?no 7. Any artificial heart valves, MVP, or defibrillator?no    MEDICATIONS & ALLERGIES:    Patient reports the following regarding taking any anticoagulation/antiplatelet therapy:   Plavix, Coumadin, Eliquis, Xarelto, Lovenox, Pradaxa, Brilinta, or Effient? no Aspirin? no  Patient confirms/reports the following medications:  Current Outpatient Medications  Medication Sig Dispense Refill   aspirin 81 MG EC tablet Take 81 mg by mouth daily. Swallow whole. (Patient not taking: Reported on 08/19/2023)     Cholecalciferol (VITAMIN D3 PO) Take 2,000 Units by mouth 2 (two) times a week. (Patient not taking: Reported on 04/24/2020)     lidocaine  (LIDODERM ) 5 % Place 1 patch onto the skin daily. Remove & Discard patch within 12 hours or as directed by MD 30 patch 5   lisinopril  (ZESTRIL ) 5 MG tablet Take 1 tablet (5 mg total) by mouth daily. 90 tablet 3   MAGNESIUM PO Take 150 mg by mouth daily. (Patient not taking: No sig reported)     ondansetron  (ZOFRAN  ODT) 4 MG disintegrating tablet Allow 1-2 tablets to dissolve in your mouth every 8 hours as needed for nausea/vomiting 30 tablet 0   rosuvastatin  (CRESTOR ) 5 MG tablet TK 1 T PO QD FOR HIGH CHOLESTEROL 90 tablet 3   traZODone (DESYREL) 50 MG tablet Take 75 mg by mouth.     No current facility-administered  medications for this visit.    Patient confirms/reports the following allergies:  Allergies  Allergen Reactions   Shellfish Allergy Swelling    Hives, allergic to crab specifically    No orders of the defined types were placed in this encounter.   AUTHORIZATION INFORMATION Primary Insurance: 1D#: Group #:  Secondary Insurance: 1D#: Group #:  SCHEDULE INFORMATION: Date: 09/14/23 Time: Location: [facility]

## 2023-09-14 ENCOUNTER — Encounter
Admission: RE | Disposition: A | Discharge status: Home / Self Care | Payer: Self-pay | Source: Home / Self Care | Attending: Gastroenterology

## 2023-09-14 ENCOUNTER — Ambulatory Visit: Admitting: Anesthesiology

## 2023-09-14 ENCOUNTER — Ambulatory Visit
Admission: RE | Admit: 2023-09-14 | Discharge: 2023-09-14 | Disposition: A | Discharge status: Home / Self Care | Attending: Gastroenterology | Admitting: Gastroenterology

## 2023-09-14 ENCOUNTER — Encounter: Payer: Self-pay | Admitting: Gastroenterology

## 2023-09-14 DIAGNOSIS — K5730 Diverticulosis of large intestine without perforation or abscess without bleeding: Secondary | ICD-10-CM | POA: Insufficient documentation

## 2023-09-14 DIAGNOSIS — Z860101 Personal history of adenomatous and serrated colon polyps: Secondary | ICD-10-CM | POA: Diagnosis present

## 2023-09-14 DIAGNOSIS — K640 First degree hemorrhoids: Secondary | ICD-10-CM | POA: Insufficient documentation

## 2023-09-14 DIAGNOSIS — Z1211 Encounter for screening for malignant neoplasm of colon: Secondary | ICD-10-CM | POA: Diagnosis not present

## 2023-09-14 DIAGNOSIS — Z860100 Personal history of colon polyps, unspecified: Secondary | ICD-10-CM

## 2023-09-14 DIAGNOSIS — Z79899 Other long term (current) drug therapy: Secondary | ICD-10-CM | POA: Diagnosis not present

## 2023-09-14 DIAGNOSIS — Z8249 Family history of ischemic heart disease and other diseases of the circulatory system: Secondary | ICD-10-CM | POA: Diagnosis not present

## 2023-09-14 DIAGNOSIS — K635 Polyp of colon: Secondary | ICD-10-CM | POA: Insufficient documentation

## 2023-09-14 DIAGNOSIS — I10 Essential (primary) hypertension: Secondary | ICD-10-CM | POA: Insufficient documentation

## 2023-09-14 HISTORY — PX: POLYPECTOMY: SHX149

## 2023-09-14 HISTORY — PX: COLONOSCOPY: SHX5424

## 2023-09-14 SURGERY — COLONOSCOPY
Anesthesia: General

## 2023-09-14 MED ORDER — PROPOFOL 10 MG/ML IV BOLUS
INTRAVENOUS | Status: DC | PRN
  Administered 2023-09-14 (×2): 40 mg via INTRAVENOUS
  Administered 2023-09-14: 100 mg via INTRAVENOUS
  Administered 2023-09-14 (×2): 40 mg via INTRAVENOUS

## 2023-09-14 MED ORDER — PHENYLEPHRINE 80 MCG/ML (10ML) SYRINGE FOR IV PUSH (FOR BLOOD PRESSURE SUPPORT)
PREFILLED_SYRINGE | INTRAVENOUS | Status: DC | PRN
  Administered 2023-09-14 (×2): 80 ug via INTRAVENOUS

## 2023-09-14 MED ORDER — SODIUM CHLORIDE 0.9 % IV SOLN: INTRAVENOUS | Status: DC

## 2023-09-14 MED ORDER — LIDOCAINE HCL (CARDIAC) PF 100 MG/5ML IV SOSY
PREFILLED_SYRINGE | INTRAVENOUS | Status: DC | PRN
  Administered 2023-09-14: 50 mg via INTRAVENOUS

## 2023-09-14 NOTE — Op Note (Signed)
 [facility] Gastroenterology Patient Name: Mehreen Azizi Procedure Date: 09/14/2023 8:31 AM MRN: [id number] Account #: [id number] Date of Birth: [birth year] Admit Type: Ambulatory Age: 73 Room: [facility] ENDO ROOM 4 Gender: Female Note Status: Finalized Instrument Name: Arvis [id number] Procedure:             Colonoscopy Indications:           High risk colon cancer surveillance: Personal history                         of colonic polyps Providers:             Rogelia Copping MD, MD Medicines:             Propofol  per Anesthesia Complications:         No immediate complications. Procedure:             Pre-Anesthesia Assessment:                        - Prior to the procedure, a History and Physical was                         performed, and patient medications and allergies were                         reviewed. The patient's tolerance of previous                         anesthesia was also reviewed. The risks and benefits                         of the procedure and the sedation options and risks                         were discussed with the patient. All questions were                         answered, and informed consent was obtained. Prior                         Anticoagulants: The patient has taken no anticoagulant                         or antiplatelet agents. ASA Grade Assessment: II - A                         patient with mild systemic disease. After reviewing                         the risks and benefits, the patient was deemed in                         satisfactory condition to undergo the procedure.                        After obtaining informed consent, the colonoscope was                         passed under direct vision. Throughout  the procedure,                         the patient's blood pressure, pulse, and oxygen                         saturations were monitored continuously. The                         Colonoscope was introduced through the anus  and                         advanced to the the cecum, identified by appendiceal                         orifice and ileocecal valve. The colonoscopy was                         performed without difficulty. The patient tolerated                         the procedure well. The quality of the bowel                         preparation was excellent. Findings:      The perianal and digital rectal examinations were normal.      Two sessile polyps were found in the sigmoid colon. The polyps were 2 to       3 mm in size. These polyps were removed with a cold snare. Resection and       retrieval were complete.      Multiple small-mouthed diverticula were found in the sigmoid colon.      Non-bleeding internal hemorrhoids were found during retroflexion. The       hemorrhoids were Grade I (internal hemorrhoids that do not prolapse). Impression:            - Two 2 to 3 mm polyps in the sigmoid colon, removed                         with a cold snare. Resected and retrieved.                        - Diverticulosis in the sigmoid colon.                        - Non-bleeding internal hemorrhoids. Recommendation:        - Discharge patient to home.                        - Resume previous diet.                        - Continue present medications.                        - Await pathology results.                        - Repeat colonoscopy is not recommended for  surveillance. Procedure Code(s):     --- Professional ---                        [phone], Colonoscopy, flexible; with removal of                         tumor(s), polyp(s), or other lesion(s) by snare                         technique Diagnosis Code(s):     --- Professional ---                        Z86.010, Personal history of colonic polyps                        D12.5, Benign neoplasm of sigmoid colon CPT copyright 2022 American Medical Association. All rights reserved. The codes documented in this report are  preliminary and upon coder review may  be revised to meet current compliance requirements. Rogelia Copping MD, MD 09/14/2023 9:32:24 AM This report has been signed electronically. Number of Addenda: 0 Note Initiated On: 09/14/2023 8:31 AM Scope Withdrawal Time: 0 hours 9 minutes 12 seconds  Total Procedure Duration: 0 hours 18 minutes 3 seconds  Estimated Blood Loss:  Estimated blood loss: none.      [facility]

## 2023-09-14 NOTE — Anesthesia Preprocedure Evaluation (Signed)
 Anesthesia Evaluation  Patient identified by MRN, date of birth, ID band Patient awake    Reviewed: Allergy & Precautions, H&P , NPO status , Patient's Chart, lab work & pertinent test results, reviewed documented beta blocker date and time   History of Anesthesia Complications (+) PONV and history of anesthetic complications  Airway Mallampati: II   Neck ROM: full    Dental  (+) Poor Dentition   Pulmonary neg pulmonary ROS   Pulmonary exam normal        Cardiovascular Exercise Tolerance: Poor hypertension, On Medications negative cardio ROS Normal cardiovascular exam Rhythm:regular Rate:Normal     Neuro/Psych negative neurological ROS  negative psych ROS   GI/Hepatic negative GI ROS, Neg liver ROS,,,  Endo/Other  negative endocrine ROS    Renal/GU negative Renal ROS  negative genitourinary   Musculoskeletal   Abdominal   Peds  Hematology negative hematology ROS (+)   Anesthesia Other Findings Past Medical History: No date: Arthritis     Comment:  hands No date: Hyperlipidemia No date: Hypertension No date: PONV (postoperative nausea and vomiting)     Comment:  also, slow to wake Past Surgical History: 04/30/2020: COLONOSCOPY WITH PROPOFOL ; N/A     Comment:  Procedure: COLONOSCOPY WITH PROPOFOL ;  Surgeon:               Janalyn Keene NOVAK, MD;  Location: [facility];              Service: Endoscopy;  Laterality: N/A;  priority 4 04/30/2020: POLYPECTOMY; N/A     Comment:  Procedure: POLYPECTOMY;  Surgeon: Janalyn Keene NOVAK,               MD;  Location: [facility];  Service: Endoscopy;               Laterality: N/A; No date: TUBAL LIGATION 1990: WRIST SURGERY; Right BMI    Body Mass Index: 20.65 kg/m     Reproductive/Obstetrics negative OB ROS                              Anesthesia Physical Anesthesia Plan  ASA: 2  Anesthesia Plan: General    Post-op Pain Management:    Induction:   PONV Risk Score and Plan:   Airway Management Planned:   Additional Equipment:   Intra-op Plan:   Post-operative Plan:   Informed Consent: I have reviewed the patients History and Physical, chart, labs and discussed the procedure including the risks, benefits and alternatives for the proposed anesthesia with the patient or authorized representative who has indicated his/her understanding and acceptance.     Dental Advisory Given  Plan Discussed with: CRNA  Anesthesia Plan Comments:         Anesthesia Quick Evaluation

## 2023-09-14 NOTE — Transfer of Care (Signed)
 Immediate Anesthesia Transfer of Care Note  Patient: [facility]  Procedure(s) Performed: COLONOSCOPY POLYPECTOMY, INTESTINE  Patient Location: Endoscopy Unit  Anesthesia Type:General  Level of Consciousness: awake, alert , and oriented  Airway & Oxygen Therapy: Patient Spontanous Breathing  Post-op Assessment: Report given to RN, Post -op Vital signs reviewed and stable, and Patient moving all extremities  Post vital signs: Reviewed and stable  Last Vitals:  Vitals Value Taken Time  BP 113/62 09/14/23 09:34  Temp 35.6 C 09/14/23 09:33  Pulse 82 09/14/23 09:34  Resp 13 09/14/23 09:35  SpO2 97 % 09/14/23 09:34  Vitals shown include unfiled device data.  Last Pain:  Vitals:   09/14/23 0933  TempSrc: Temporal         Complications: No notable events documented.

## 2023-09-14 NOTE — H&P (Signed)
 Cassandra Copping, MD [facility] [address], [address] [place], [place] [address] Phone:[phone] Fax : [phone]  Primary Care Physician:  Sherial Bail, MD Primary Gastroenterologist:  Dr. Copping  Pre-Procedure History & Physical: HPI:  WYNEMA Cooley is a 73 y.o. female is here for an colonoscopy.   Past Medical History:  Diagnosis Date   Arthritis    hands   Hyperlipidemia    Hypertension    PONV (postoperative nausea and vomiting)    also, slow to wake    Past Surgical History:  Procedure Laterality Date   COLONOSCOPY WITH PROPOFOL  N/A 04/30/2020   Procedure: COLONOSCOPY WITH PROPOFOL ;  Surgeon: Janalyn Keene NOVAK, MD;  Location: [facility];  Service: Endoscopy;  Laterality: N/A;  priority 4   POLYPECTOMY N/A 04/30/2020   Procedure: POLYPECTOMY;  Surgeon: Janalyn Keene NOVAK, MD;  Location: [facility];  Service: Endoscopy;  Laterality: N/A;   TUBAL LIGATION     WRIST SURGERY Right 1990    Prior to Admission medications   Medication Sig Start Date End Date Taking? Authorizing Provider  lisinopril  (ZESTRIL ) 5 MG tablet Take 1 tablet (5 mg total) by mouth daily. 01/23/20  Yes Malfi, Nat CHRISTELLA, FNP  MAGNESIUM PO Take 150 mg by mouth daily.   Yes [provider]  rosuvastatin  (CRESTOR ) 5 MG tablet TK 1 T PO QD FOR HIGH CHOLESTEROL 01/23/20  Yes Malfi, Nat CHRISTELLA, FNP  traZODone (DESYREL) 50 MG tablet Take 75 mg by mouth. 10/09/21  Yes [provider]  aspirin 81 MG EC tablet Take 81 mg by mouth daily. Swallow whole. Patient not taking: Reported on 08/19/2023    [provider]  Cholecalciferol (VITAMIN D3 PO) Take 2,000 Units by mouth 2 (two) times a week. Patient not taking: Reported on 04/24/2020    [provider]  lidocaine  (LIDODERM ) 5 % Place 1 patch onto the skin daily. Remove & Discard patch within 12 hours or as directed by MD Patient not taking: Reported on 09/14/2023 06/20/19   Fredrik Nat CHRISTELLA, FNP  ondansetron   (ZOFRAN  ODT) 4 MG disintegrating tablet Allow 1-2 tablets to dissolve in your mouth every 8 hours as needed for nausea/vomiting 08/31/20   Gordan Huxley, MD    Allergies as of 08/24/2023 - Review Complete 08/19/2023  Allergen Reaction Noted   Other Swelling 11/08/2018   Shellfish allergy Swelling 11/08/2018    Family History  Problem Relation Age of Onset   Breast cancer Mother 37   Hyperlipidemia Father    Heart disease Father 65   Kidney failure Sister    Hypertension Sister    Diabetes Mellitus II Maternal Grandfather    Heart disease Paternal Grandmother    Heart disease Paternal Grandfather    Healthy Brother    Healthy Daughter     Social History   Socioeconomic History   Marital status: Widowed    Spouse name: Not on file   Number of children: Not on file   Years of education: Not on file   Highest education level: Not on file  Occupational History   Occupation: retired   Tobacco Use   Smoking status: Never   Smokeless tobacco: Never  Vaping Use   Vaping status: Never Used  Substance and Sexual Activity   Alcohol use: Not Currently    Alcohol/week: 3.0 standard drinks of alcohol    Types: 3 Glasses of wine per week    Comment: glass white wine every evening    Drug use: Never  Sexual activity: Not Currently  Other Topics Concern   Not on file  Social History Narrative   Not on file   Social Drivers of Health   Financial Resource Strain: Low Risk  (07/13/2023)   Received from [facility]   Overall Financial Resource Strain (CARDIA)    Difficulty of Paying Living Expenses: Not hard at all  Food Insecurity: No Food Insecurity (07/13/2023)   Received from [facility]   Hunger Vital Sign    Within the past 12 months, you worried that your food would run out before you got the money to buy more.: Never true    Within the past 12 months, the food you bought just didn't last and you didn't have money to get more.: Never true   Transportation Needs: No Transportation Needs (07/13/2023)   Received from [facility] - Transportation    In the past 12 months, has lack of transportation kept you from medical appointments or from getting medications?: No    Lack of Transportation (Non-Medical): No  Physical Activity: Not on file  Stress: Not on file  Social Connections: Not on file  Intimate Partner Violence: Not on file    Review of Systems: See HPI, otherwise negative ROS  Physical Exam: BP 135/78   Pulse 80   Temp (!) 96.6 F (35.9 C) (Tympanic)   Resp 16   Ht 5' 5.5 (1.664 m)   Wt 57.2 kg   BMI 20.65 kg/m  General:   Alert,  pleasant and cooperative in NAD Head:  Normocephalic and atraumatic. Neck:  Supple; no masses or thyromegaly. Lungs:  Clear throughout to auscultation.    Heart:  Regular rate and rhythm. Abdomen:  Soft, nontender and nondistended. Normal bowel sounds, without guarding, and without rebound.   Neurologic:  Alert and  oriented x4;  grossly normal neurologically.  Impression/Plan: Cassandra Cooley is here for an colonoscopy to be performed for a history of adenomatous polyps on 2022   Risks, benefits, limitations, and alternatives regarding  colonoscopy have been reviewed with the patient.  Questions have been answered.  All parties agreeable.   Cassandra Copping, MD  09/14/2023, 8:59 AM

## 2023-09-15 ENCOUNTER — Encounter: Payer: Self-pay | Admitting: Gastroenterology

## 2023-09-15 LAB — SURGICAL PATHOLOGY

## 2023-09-16 ENCOUNTER — Ambulatory Visit: Payer: Self-pay | Admitting: Gastroenterology

## 2023-09-20 NOTE — Anesthesia Postprocedure Evaluation (Signed)
 Anesthesia Post Note  Patient: Cassandra Cooley [facility]  Procedure(s) Performed: COLONOSCOPY POLYPECTOMY, INTESTINE  Patient location during evaluation: PACU Anesthesia Type: General Level of consciousness: awake and alert Pain management: pain level controlled Vital Signs Assessment: post-procedure vital signs reviewed and stable Respiratory status: spontaneous breathing, nonlabored ventilation, respiratory function stable and patient connected to nasal cannula oxygen Cardiovascular status: blood pressure returned to baseline and stable Postop Assessment: no apparent nausea or vomiting Anesthetic complications: no   No notable events documented.   Last Vitals:  Vitals:   09/14/23 0943 09/14/23 0953  BP: 119/72 131/78  Pulse: 68   Resp: 12 17  Temp:    SpO2: 100% 97%    Last Pain:  Vitals:   09/14/23 0933  TempSrc: Temporal                 Cassandra Cooley

## 2023-09-21 ENCOUNTER — Ambulatory Visit
Admission: RE | Admit: 2023-09-21 | Discharge: 2023-09-21 | Disposition: A | Discharge status: Home / Self Care | Source: Ambulatory Visit | Attending: Internal Medicine | Admitting: Internal Medicine

## 2023-09-21 DIAGNOSIS — Z1231 Encounter for screening mammogram for malignant neoplasm of breast: Secondary | ICD-10-CM | POA: Diagnosis present

## 2023-09-28 ENCOUNTER — Ambulatory Visit: Attending: Internal Medicine | Admitting: Dietician

## 2023-09-28 ENCOUNTER — Encounter: Payer: Self-pay | Admitting: Dietician

## 2023-09-28 VITALS — Ht 65.0 in | Wt 134.1 lb

## 2023-09-28 DIAGNOSIS — E559 Vitamin D deficiency, unspecified: Secondary | ICD-10-CM | POA: Insufficient documentation

## 2023-09-28 DIAGNOSIS — E782 Mixed hyperlipidemia: Secondary | ICD-10-CM | POA: Insufficient documentation

## 2023-09-28 DIAGNOSIS — Z79899 Other long term (current) drug therapy: Secondary | ICD-10-CM | POA: Diagnosis not present

## 2023-09-28 DIAGNOSIS — R7303 Prediabetes: Secondary | ICD-10-CM | POA: Insufficient documentation

## 2023-09-28 DIAGNOSIS — N1831 Chronic kidney disease, stage 3a: Secondary | ICD-10-CM | POA: Insufficient documentation

## 2023-09-28 DIAGNOSIS — R634 Abnormal weight loss: Secondary | ICD-10-CM | POA: Diagnosis present

## 2023-09-28 DIAGNOSIS — Z6822 Body mass index (BMI) 22.0-22.9, adult: Secondary | ICD-10-CM | POA: Insufficient documentation

## 2023-09-28 DIAGNOSIS — Z713 Dietary counseling and surveillance: Secondary | ICD-10-CM | POA: Insufficient documentation

## 2023-09-28 NOTE — Progress Notes (Signed)
 Medical Nutrition Therapy  Appointment Start time:  1150  Appointment End time:  1225  Primary concerns today: Unexpected weight loss  Referral diagnosis: R63.4 Abnormal weight loss Preferred learning style: No preference indicated Learning readiness: Change in Progress   NUTRITION ASSESSMENT   Anthropometrics Ht: 65 Wt: 134.1 lbs BMI: 22.32 kg/m2 Wt Change: +4 lbs x6 weeks UBW: 140-145 lbs Goal Weight: 145 lbs  Clinical Medical Hx: CKD S3a, Prediabetes, HLD, Vit D deficiency Medications: Lisinopril , Rosuvastatin  Labs: RBC - 3.69 (low), Hemoglobin - 11.3 (low), eGFR - 53 (3a), A1c - 5.8% (PreDM), Cholesterol - 204 (high), HDL - 109.9 (HIGH) Notable Signs/Symptoms: IMPROVED (Oct 27, 2023) Slightly pronounced clavicles   Lifestyle & Dietary Hx Pt reports feeling energy levels, noticing changes in how their body looks (less pronounced collarbones, tighter skin on her arms), had gained ~6 lbs but had to prep for colonoscopy a couple weeks ago and lost a little weight. Pt reports drinking at least 1 Boost PLUS daily, prefers strawberry. Pt reports focusing on proteins (chicken, beef 2x weekly, fish), eating more greens, cabbage. Pt reports lower stress, watching happier movies and continuing to read books. Pt reports occasional lower blood pressure (119/58), will have a salty snack to bring it back to 120-130/80, reports not taking Lisinopril  for last few months either.   Estimated daily fluid intake: >64 oz Supplements: Vitamin D Sleep: Improved with Trazodone Stress: Decreased Current average weekly physical activity: ADLs, walks a lot   24-Hr Dietary Recall First Meal: Bran muffin, strawberries, coffee, water Snack: Boost PLUS Second Meal: Leftover chicken egg foo yung, pork steamed rice Snack:  Third Meal: Cabbage, potatoes, Chicken egg foo yung Snack:  Beverages: Water, coffee,   NUTRITION DIAGNOSIS  -3.2 Unintentional weight loss As related to inadequate calorie  intake.  As evidenced by weight loss of 10 lbs in last 3 months, avoidance of high calorie foods for health concerns.   NUTRITION INTERVENTION  Nutrition education (E-1) on the following topics:  Educated patient on the role of increasing calories and protein on building muscle mass and healthy weight gain. Worked with patient to identify sources of protein and healthy fats, as well as how to increase calories when preparing food. Educated patient to eat 5-6 times a day to maximize protein intake. Encourage patient to incorporate physical activity, especially resistance training, as much as possible.   Handouts Provided Include  High Protein High Calorie Nutrition Therapy  Learning Style & Readiness for Change Teaching method utilized: Visual & Auditory  Demonstrated degree of understanding via: Teach Back  Barriers to learning/adherence to lifestyle change: None  Goals Established by Pt Add in a glass Farilife chocolate chocolate milk with your fruit for your afternoon snack. Keep up the great work with your weight gain! You are doing an amazing job!!   MONITORING & EVALUATION Dietary intake, weekly physical activity, and weight gain in 6-8 weeks.  Next Steps  Patient is to follow up with RD.

## 2023-09-28 NOTE — Patient Instructions (Addendum)
 Add in a glass Farilife chocolate chocolate milk with your fruit for your afternoon snack.  Keep up the great work with your weight gain! You are doing an amazing job!!

## 2023-11-11 ENCOUNTER — Ambulatory Visit: Admitting: Dietician

## 2023-11-16 ENCOUNTER — Encounter: Attending: Internal Medicine | Admitting: Dietician

## 2023-11-16 ENCOUNTER — Encounter: Payer: Self-pay | Admitting: Dietician

## 2023-11-16 VITALS — Ht 65.0 in | Wt 135.4 lb

## 2023-11-16 DIAGNOSIS — E782 Mixed hyperlipidemia: Secondary | ICD-10-CM | POA: Insufficient documentation

## 2023-11-16 DIAGNOSIS — E559 Vitamin D deficiency, unspecified: Secondary | ICD-10-CM | POA: Diagnosis not present

## 2023-11-16 DIAGNOSIS — R7303 Prediabetes: Secondary | ICD-10-CM | POA: Diagnosis not present

## 2023-11-16 DIAGNOSIS — N1831 Chronic kidney disease, stage 3a: Secondary | ICD-10-CM | POA: Diagnosis not present

## 2023-11-16 DIAGNOSIS — Z713 Dietary counseling and surveillance: Secondary | ICD-10-CM | POA: Diagnosis not present

## 2023-11-16 DIAGNOSIS — Z6822 Body mass index (BMI) 22.0-22.9, adult: Secondary | ICD-10-CM | POA: Diagnosis not present

## 2023-11-16 DIAGNOSIS — R634 Abnormal weight loss: Secondary | ICD-10-CM | POA: Insufficient documentation

## 2023-11-16 NOTE — Patient Instructions (Signed)
 Keep up the great work!!   Continue eating multiple times throughout the day, and have a good source of protein at each meal and/or snack!

## 2023-11-16 NOTE — Progress Notes (Signed)
 Medical Nutrition Therapy  Appointment Start time:  1130  Appointment End time:  1200  Primary concerns today: Unexpected weight loss  Referral diagnosis: R63.4 Abnormal weight loss Preferred learning style: No preference indicated Learning readiness: Change in Progress   NUTRITION ASSESSMENT   Anthropometrics Ht: 65 Wt: 135.4 lbs BMI: 22.53 kg/m2 Wt Change: +5 lbs x6 weeks UBW: 140-145 lbs Goal Weight: 145 lbs   Clinical Medical Hx: CKD S3a, Prediabetes, HLD, Vit D deficiency Medications: Lisinopril , Rosuvastatin  Labs: RBC - 3.69 (low), Hemoglobin - 11.3 (low), eGFR - 53 (3a), A1c - 5.8% (PreDM), Cholesterol - 204 (high), HDL - 109.9 (HIGH) Notable Signs/Symptoms: Desired weight gain   Lifestyle & Dietary Hx Pt reports clothes are fitting a little tighter, states they feel happier and less stressed, less worried about their weight. Pt reports plans to travel more now that they are feeling better. Pt reports consistently eating 3 meals daily now, smaller breakfast and lunch and a larger dinner around 4:30 pm, tries not to eat after 5:00 pm. Pt reports getting a stomach virus 2 weeks ago, states they were vomiting for a couple of days and couldn't keep any food down, pt states they probably lost 4-5 lbs from this. Pt reports continuing to monitor blood pressure, states it stays around 120-125/70.    Estimated daily fluid intake: >64 oz Supplements: Vitamin D Sleep: Improved with Trazodone Stress: Low Current average weekly physical activity: ADLs, walks a lot   24-Hr Dietary Recall First Meal: Bran muffin, strawberries, coffee Snack: 1 Boost PLUS Second Meal: Chicken sandwich with mayonnaise and cheese Snack:  Third Meal: Kale and collard greens, mac and cheese, baked chicken Snack:  Beverages: Water, coffee   NUTRITION DIAGNOSIS  -3.2 Unintentional weight loss As related to inadequate calorie intake.  As evidenced by weight loss of 10 lbs in last 3 months,  avoidance of high calorie foods for health concerns.   NUTRITION INTERVENTION  Nutrition education (E-1) on the following topics:  Educated patient on the role of increasing calories and protein on building muscle mass and healthy weight gain. Worked with patient to identify sources of protein and healthy fats, as well as how to increase calories when preparing food. Educated patient to eat 5-6 times a day to maximize protein intake. Encourage patient to incorporate physical activity, especially resistance training, as much as possible.   Handouts Provided Include  High Protein High Calorie Nutrition Therapy  Learning Style & Readiness for Change Teaching method utilized: Visual & Auditory  Demonstrated degree of understanding via: Teach Back  Barriers to learning/adherence to lifestyle change: None  Goals Established by Pt Keep up the great work!!  Continue eating multiple times throughout the day, and have a good source of protein at each meal and/or snack!   MONITORING & EVALUATION Dietary intake, weekly physical activity, and weight gain in 8 weeks.  Next Steps  Patient is to follow up with RD.

## 2024-01-04 IMAGING — MG MM DIGITAL SCREENING BILAT W/ TOMO AND CAD
6 of 10 series · 6 of 30 positions shown · non-contrast
Comparison: Previous exam(s).

CLINICAL DATA: Screening.

EXAM:
DIGITAL SCREENING BILATERAL MAMMOGRAM WITH TOMOSYNTHESIS AND CAD
TECHNIQUE: Bilateral screening digital craniocaudal and mediolateral oblique
mammograms were obtained. Bilateral screening digital breast
tomosynthesis was performed. The images were evaluated with
computer-aided detection.

[L MLO synth-2D]
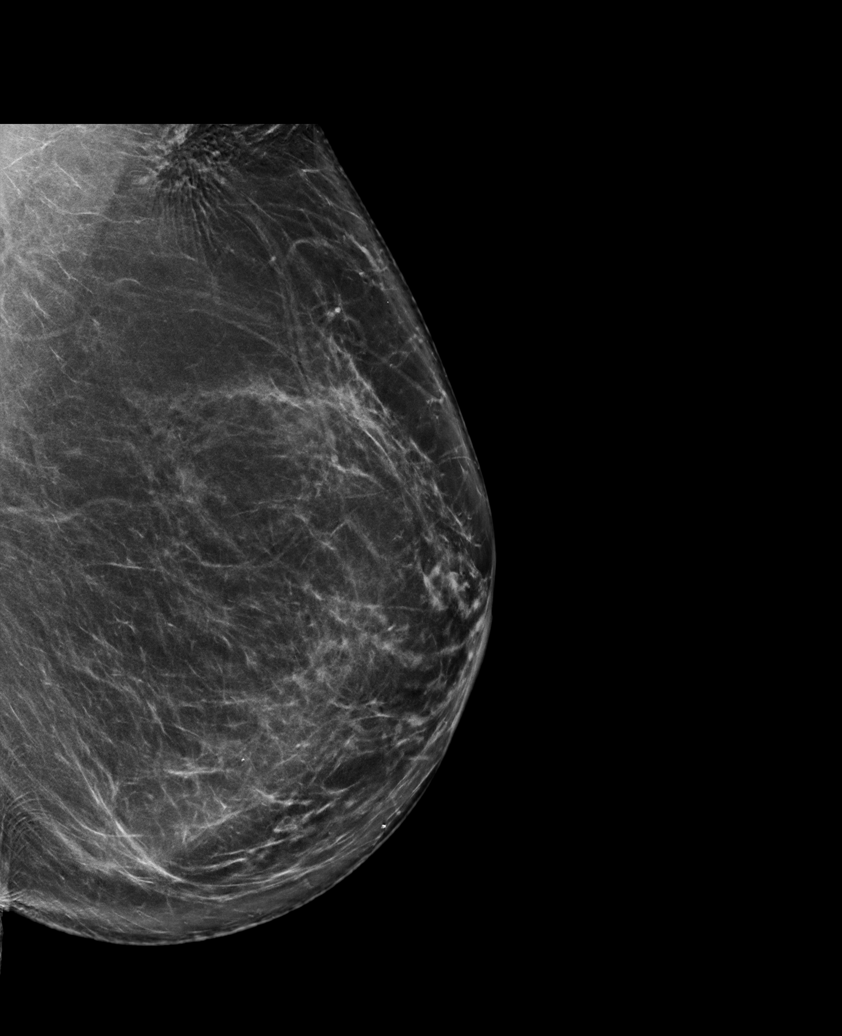

[L CC synth-2D (1 of 2)]
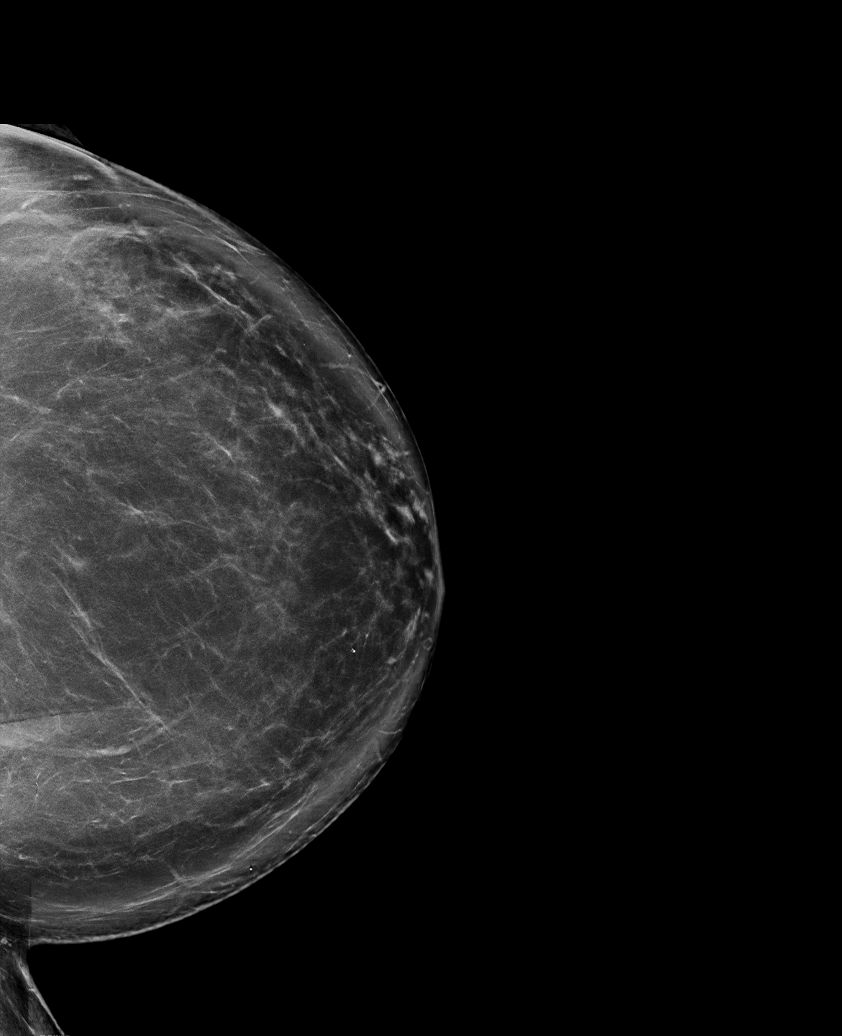

[R MLO synth-2D]
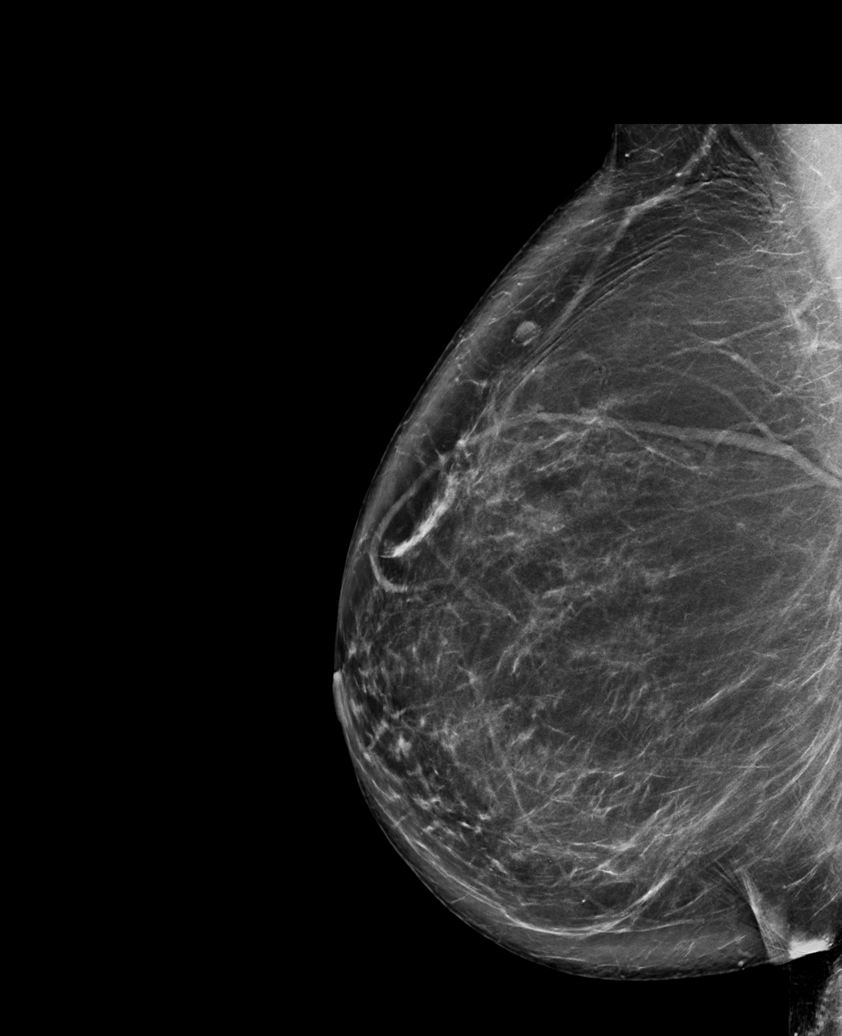

[R CC synth-2D]
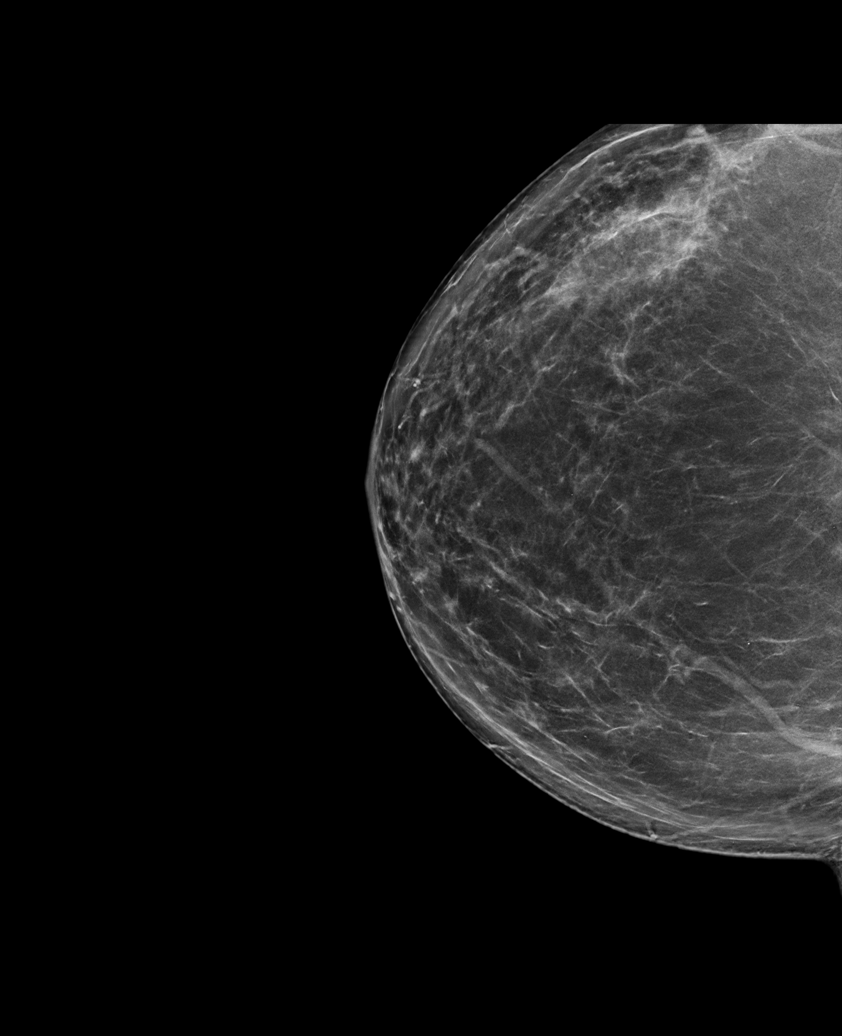

[L CC synth-2D (2 of 2)]
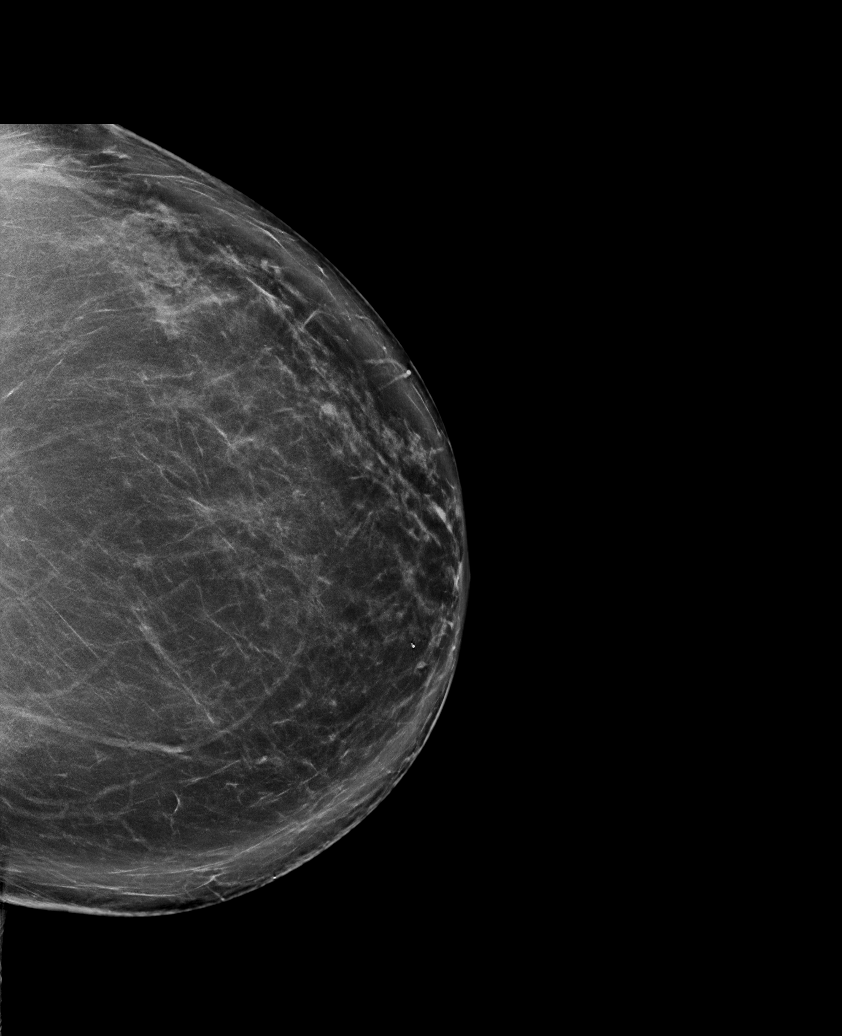

[R CC tomo · tomo slice 46/91.0]
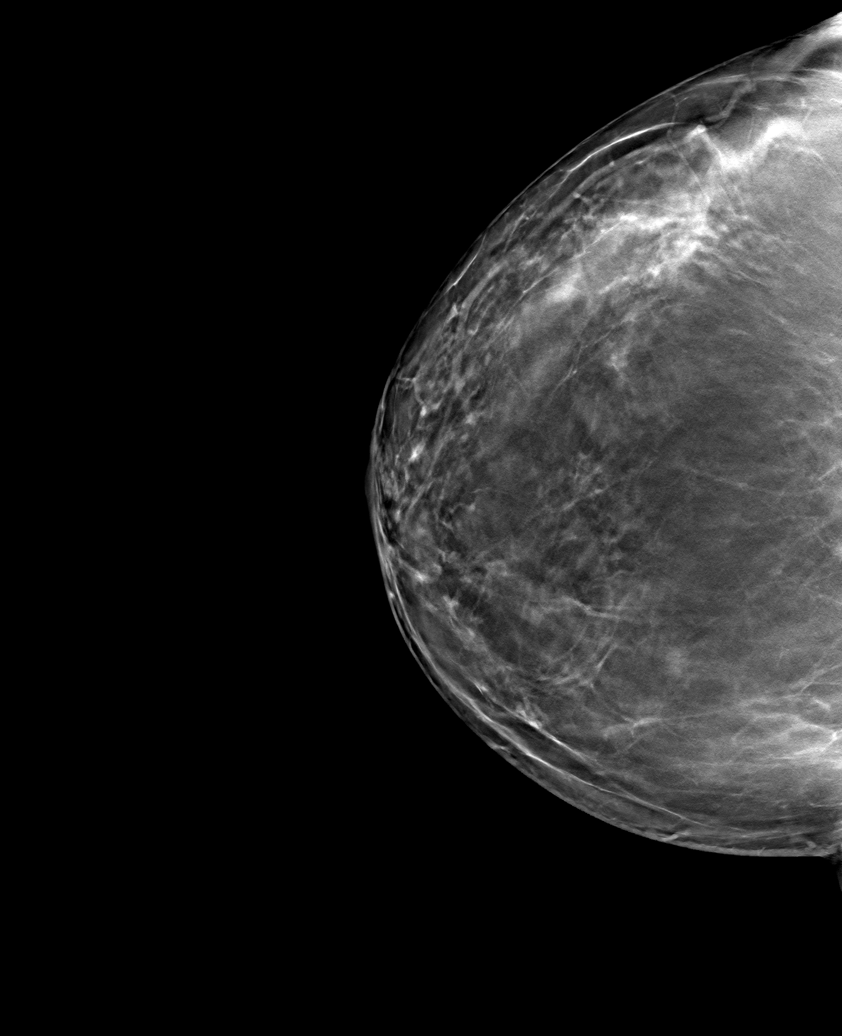

[6 of 30 positions shown; findings below may reference images not displayed]

ACR Breast Density Category c: The breast tissue is heterogeneously
dense, which may obscure small masses.
FINDINGS: There are no findings suspicious for malignancy.
IMPRESSION: No mammographic evidence of malignancy. A result letter of this
screening mammogram will be mailed directly to the patient.

RECOMMENDATION:
Screening mammogram in one year. (Code:Q3-W-BC3)

BI-RADS CATEGORY  1: Negative.

## 2024-01-20 ENCOUNTER — Encounter: Attending: Internal Medicine | Admitting: Dietician

## 2024-01-20 ENCOUNTER — Encounter: Payer: Self-pay | Admitting: Dietician

## 2024-01-20 VITALS — Ht 65.0 in | Wt 136.6 lb

## 2024-01-20 DIAGNOSIS — E785 Hyperlipidemia, unspecified: Secondary | ICD-10-CM | POA: Diagnosis not present

## 2024-01-20 DIAGNOSIS — R7303 Prediabetes: Secondary | ICD-10-CM | POA: Diagnosis not present

## 2024-01-20 DIAGNOSIS — Z79899 Other long term (current) drug therapy: Secondary | ICD-10-CM | POA: Insufficient documentation

## 2024-01-20 DIAGNOSIS — E559 Vitamin D deficiency, unspecified: Secondary | ICD-10-CM | POA: Diagnosis not present

## 2024-01-20 DIAGNOSIS — N1831 Chronic kidney disease, stage 3a: Secondary | ICD-10-CM | POA: Insufficient documentation

## 2024-01-20 DIAGNOSIS — R634 Abnormal weight loss: Secondary | ICD-10-CM | POA: Insufficient documentation

## 2024-01-20 NOTE — Progress Notes (Signed)
 Medical Nutrition Therapy  Appointment Start time:  69  Appointment End time:  1210  Primary concerns today: Unexpected weight loss  Referral diagnosis: R63.4 Abnormal weight loss Preferred learning style: No preference indicated Learning readiness: Change in Progress   NUTRITION ASSESSMENT   Anthropometrics Ht: 65 Wt: 136.6 lbs BMI: 22.73 kg/m2 Wt Change: +10 lbs x4 months UBW: 140-145 lbs Goal Weight: 145 lbs   Clinical Medical Hx: CKD S3a, Prediabetes, HLD, Vit D deficiency Medications: Lisinopril , Rosuvastatin  Labs (07/06/2023): RBC - 3.69 (low), Hemoglobin - 11.3 (low), eGFR - 53 (3a), A1c - 5.8% (PreDM), Cholesterol - 204 (high), HDL - 109.9 (HIGH) Notable Signs/Symptoms: Desired weight gain   Lifestyle & Dietary Hx Pt reports clothes continue to fit tighter, feeling well overall, waking up more refreshed. Pt reports blood pressure has stayed well controlled, states they have not taken any blood pressure medication in a month. Pt reports that they will be going back to [place]  for the next 3 months to visit family, states they will be eating well and staying active during this time Pt reports increase in arthritis with colder weather, discomfort/stiffness subsides as they warm up. Pt reports [facility], states they will be interested in doing stretching/resistance exercise. Pt reports eating fruit and bran muffin daily for breakfast, sometimes wit Activia yogurt, salad w/ protein for lunch, and dinner with a meat of some kind daily. Pt reports having Boost PLUS only once every other day now.   Estimated daily fluid intake: >64 oz Supplements: Vitamin D Sleep: Improved with Trazodone Stress: Low Current average weekly physical activity: ADLs, walks a lot   24-Hr Dietary Recall First Meal: Bran muffin, strawberries, Activia, coffee, water Snack:  Second Meal: Grilled chicken salad, w/ tomato, cheese,  cucumber, bleu cheese dressing, 4-5 PB pretzel bites Snack:  Third Meal: Cube steak w/ gravy, baked potato w/ sour cream, collard greens Snack:  Beverages: Water, coffee   NUTRITION DIAGNOSIS  Shorter-3.2 Unintentional weight loss As related to inadequate calorie intake.  As evidenced by weight loss of 10 lbs in last 3 months, avoidance of high calorie foods for health concerns.   NUTRITION INTERVENTION  Nutrition education (E-1) on the following topics:  Educated patient on the role of increasing calories and protein on building muscle mass and healthy weight gain. Worked with patient to identify sources of protein and healthy fats, as well as how to increase calories when preparing food. Educated patient to eat 5-6 times a day to maximize protein intake. Encourage patient to incorporate physical activity, especially resistance training, as much as possible.   Handouts Provided Include  High Protein High Calorie Nutrition Therapy NEW: ADA Seated Exercises  Learning Style & Readiness for Change Teaching method utilized: Visual & Auditory  Demonstrated degree of understanding via: Teach Back  Barriers to learning/adherence to lifestyle change: None  Goals Established by Pt Consider adding a half of a cinnamon roll or vanilla Premier Protein shake to your egg nog for some extra protein! Continue to moderate your sodium intake to keep your blood pressure under control. [facility]   MONITORING & EVALUATION Dietary intake, weekly physical activity, and weight gain in 4 months.  Next Steps  Patient is to follow up with RD.

## 2024-01-20 NOTE — Patient Instructions (Addendum)
 Consider adding a half of a cinnamon roll or vanilla Premier Protein shake to your egg nog for some extra protein!  Continue to moderate your sodium intake to keep your blood pressure under control.  [facility]

## 2024-05-09 ENCOUNTER — Encounter: Admitting: Dietician
